# Patient Record
Sex: Male | Born: 1935 | Hispanic: No | Smoking: Former smoker
Health system: Southern US, Community
[De-identification: ages and names within clinical notes are randomized; demographics above are authoritative.]

## PROBLEM LIST (undated history)

## (undated) DIAGNOSIS — J449 Chronic obstructive pulmonary disease, unspecified: Secondary | ICD-10-CM

## (undated) DIAGNOSIS — F419 Anxiety disorder, unspecified: Secondary | ICD-10-CM

## (undated) DIAGNOSIS — E785 Hyperlipidemia, unspecified: Secondary | ICD-10-CM

## (undated) DIAGNOSIS — C61 Malignant neoplasm of prostate: Secondary | ICD-10-CM

## (undated) DIAGNOSIS — I739 Peripheral vascular disease, unspecified: Secondary | ICD-10-CM

## (undated) DIAGNOSIS — I779 Disorder of arteries and arterioles, unspecified: Secondary | ICD-10-CM

## (undated) DIAGNOSIS — I10 Essential (primary) hypertension: Secondary | ICD-10-CM

## (undated) DIAGNOSIS — I714 Abdominal aortic aneurysm, without rupture, unspecified: Secondary | ICD-10-CM

## (undated) HISTORY — PX: OTHER SURGICAL HISTORY: SHX169

## (undated) HISTORY — PX: BACK SURGERY: SHX140

## (undated) HISTORY — PX: CAROTID ENDARTERECTOMY: SUR193

## (undated) HISTORY — PX: ABDOMINAL AORTIC ANEURYSM REPAIR: SUR1152

---

## 1997-12-16 ENCOUNTER — Other Ambulatory Visit: Admission: RE | Admit: 1997-12-16 | Discharge: 1997-12-16 | Payer: Self-pay | Admitting: *Deleted

## 2020-08-29 ENCOUNTER — Ambulatory Visit (HOSPITAL_COMMUNITY): Admit: 2020-08-29 | Payer: Medicare Other | Admitting: Interventional Cardiology

## 2020-08-29 ENCOUNTER — Inpatient Hospital Stay (HOSPITAL_COMMUNITY)
Admission: EM | Disposition: A | Discharge status: Skilled Nursing Facility | Payer: Self-pay | Source: Home / Self Care | Attending: Interventional Cardiology

## 2020-08-29 ENCOUNTER — Other Ambulatory Visit: Payer: Self-pay

## 2020-08-29 ENCOUNTER — Emergency Department (HOSPITAL_COMMUNITY): Payer: Medicare Other

## 2020-08-29 ENCOUNTER — Encounter (HOSPITAL_COMMUNITY)
Admission: EM | Disposition: A | Discharge status: Skilled Nursing Facility | Payer: Self-pay | Source: Home / Self Care | Attending: Interventional Cardiology

## 2020-08-29 ENCOUNTER — Inpatient Hospital Stay (HOSPITAL_COMMUNITY)
Admission: EM | Admit: 2020-08-29 | Discharge: 2020-09-12 | DRG: 246 | Disposition: A | Discharge status: Skilled Nursing Facility | Payer: Medicare Other | Attending: Cardiology | Admitting: Cardiology

## 2020-08-29 ENCOUNTER — Encounter (HOSPITAL_COMMUNITY): Payer: Self-pay | Admitting: Cardiology

## 2020-08-29 DIAGNOSIS — I469 Cardiac arrest, cause unspecified: Secondary | ICD-10-CM | POA: Diagnosis not present

## 2020-08-29 DIAGNOSIS — I959 Hypotension, unspecified: Secondary | ICD-10-CM

## 2020-08-29 DIAGNOSIS — R571 Hypovolemic shock: Secondary | ICD-10-CM | POA: Diagnosis not present

## 2020-08-29 DIAGNOSIS — J189 Pneumonia, unspecified organism: Secondary | ICD-10-CM

## 2020-08-29 DIAGNOSIS — I252 Old myocardial infarction: Secondary | ICD-10-CM | POA: Diagnosis not present

## 2020-08-29 DIAGNOSIS — I462 Cardiac arrest due to underlying cardiac condition: Secondary | ICD-10-CM | POA: Diagnosis present

## 2020-08-29 DIAGNOSIS — R64 Cachexia: Secondary | ICD-10-CM | POA: Diagnosis present

## 2020-08-29 DIAGNOSIS — F419 Anxiety disorder, unspecified: Secondary | ICD-10-CM

## 2020-08-29 DIAGNOSIS — J690 Pneumonitis due to inhalation of food and vomit: Secondary | ICD-10-CM | POA: Diagnosis not present

## 2020-08-29 DIAGNOSIS — Z515 Encounter for palliative care: Secondary | ICD-10-CM

## 2020-08-29 DIAGNOSIS — J9601 Acute respiratory failure with hypoxia: Secondary | ICD-10-CM | POA: Diagnosis not present

## 2020-08-29 DIAGNOSIS — Z8546 Personal history of malignant neoplasm of prostate: Secondary | ICD-10-CM | POA: Diagnosis not present

## 2020-08-29 DIAGNOSIS — R570 Cardiogenic shock: Secondary | ICD-10-CM | POA: Diagnosis not present

## 2020-08-29 DIAGNOSIS — R0602 Shortness of breath: Secondary | ICD-10-CM | POA: Diagnosis not present

## 2020-08-29 DIAGNOSIS — I5041 Acute combined systolic (congestive) and diastolic (congestive) heart failure: Secondary | ICD-10-CM | POA: Diagnosis present

## 2020-08-29 DIAGNOSIS — E43 Unspecified severe protein-calorie malnutrition: Secondary | ICD-10-CM | POA: Diagnosis not present

## 2020-08-29 DIAGNOSIS — I2584 Coronary atherosclerosis due to calcified coronary lesion: Secondary | ICD-10-CM | POA: Diagnosis present

## 2020-08-29 DIAGNOSIS — R627 Adult failure to thrive: Secondary | ICD-10-CM

## 2020-08-29 DIAGNOSIS — J440 Chronic obstructive pulmonary disease with acute lower respiratory infection: Secondary | ICD-10-CM | POA: Diagnosis not present

## 2020-08-29 DIAGNOSIS — Z888 Allergy status to other drugs, medicaments and biological substances status: Secondary | ICD-10-CM | POA: Diagnosis not present

## 2020-08-29 DIAGNOSIS — I714 Abdominal aortic aneurysm, without rupture, unspecified: Secondary | ICD-10-CM

## 2020-08-29 DIAGNOSIS — R1310 Dysphagia, unspecified: Secondary | ICD-10-CM

## 2020-08-29 DIAGNOSIS — J9621 Acute and chronic respiratory failure with hypoxia: Secondary | ICD-10-CM | POA: Diagnosis not present

## 2020-08-29 DIAGNOSIS — I4901 Ventricular fibrillation: Secondary | ICD-10-CM | POA: Diagnosis present

## 2020-08-29 DIAGNOSIS — Z8679 Personal history of other diseases of the circulatory system: Secondary | ICD-10-CM

## 2020-08-29 DIAGNOSIS — Z9189 Other specified personal risk factors, not elsewhere classified: Secondary | ICD-10-CM | POA: Diagnosis not present

## 2020-08-29 DIAGNOSIS — I340 Nonrheumatic mitral (valve) insufficiency: Secondary | ICD-10-CM | POA: Diagnosis not present

## 2020-08-29 DIAGNOSIS — R0902 Hypoxemia: Secondary | ICD-10-CM | POA: Diagnosis not present

## 2020-08-29 DIAGNOSIS — E049 Nontoxic goiter, unspecified: Secondary | ICD-10-CM | POA: Diagnosis present

## 2020-08-29 DIAGNOSIS — I952 Hypotension due to drugs: Secondary | ICD-10-CM | POA: Diagnosis not present

## 2020-08-29 DIAGNOSIS — Z8249 Family history of ischemic heart disease and other diseases of the circulatory system: Secondary | ICD-10-CM

## 2020-08-29 DIAGNOSIS — I2510 Atherosclerotic heart disease of native coronary artery without angina pectoris: Secondary | ICD-10-CM | POA: Diagnosis not present

## 2020-08-29 DIAGNOSIS — Z7189 Other specified counseling: Secondary | ICD-10-CM | POA: Diagnosis not present

## 2020-08-29 DIAGNOSIS — Z681 Body mass index (BMI) 19 or less, adult: Secondary | ICD-10-CM | POA: Diagnosis not present

## 2020-08-29 DIAGNOSIS — I213 ST elevation (STEMI) myocardial infarction of unspecified site: Secondary | ICD-10-CM | POA: Diagnosis present

## 2020-08-29 DIAGNOSIS — I5021 Acute systolic (congestive) heart failure: Secondary | ICD-10-CM | POA: Diagnosis not present

## 2020-08-29 DIAGNOSIS — E785 Hyperlipidemia, unspecified: Secondary | ICD-10-CM

## 2020-08-29 DIAGNOSIS — E872 Acidosis: Secondary | ICD-10-CM | POA: Diagnosis not present

## 2020-08-29 DIAGNOSIS — Z79899 Other long term (current) drug therapy: Secondary | ICD-10-CM

## 2020-08-29 DIAGNOSIS — I25110 Atherosclerotic heart disease of native coronary artery with unstable angina pectoris: Secondary | ICD-10-CM | POA: Diagnosis not present

## 2020-08-29 DIAGNOSIS — D696 Thrombocytopenia, unspecified: Secondary | ICD-10-CM | POA: Diagnosis not present

## 2020-08-29 DIAGNOSIS — Z87891 Personal history of nicotine dependence: Secondary | ICD-10-CM

## 2020-08-29 DIAGNOSIS — I361 Nonrheumatic tricuspid (valve) insufficiency: Secondary | ICD-10-CM

## 2020-08-29 DIAGNOSIS — L89311 Pressure ulcer of right buttock, stage 1: Secondary | ICD-10-CM | POA: Diagnosis not present

## 2020-08-29 DIAGNOSIS — N179 Acute kidney failure, unspecified: Secondary | ICD-10-CM | POA: Diagnosis not present

## 2020-08-29 DIAGNOSIS — Z66 Do not resuscitate: Secondary | ICD-10-CM | POA: Diagnosis not present

## 2020-08-29 DIAGNOSIS — R339 Retention of urine, unspecified: Secondary | ICD-10-CM

## 2020-08-29 DIAGNOSIS — Z9861 Coronary angioplasty status: Secondary | ICD-10-CM

## 2020-08-29 DIAGNOSIS — J441 Chronic obstructive pulmonary disease with (acute) exacerbation: Secondary | ICD-10-CM | POA: Diagnosis present

## 2020-08-29 DIAGNOSIS — Z20822 Contact with and (suspected) exposure to covid-19: Secondary | ICD-10-CM | POA: Diagnosis present

## 2020-08-29 DIAGNOSIS — L8990 Pressure ulcer of unspecified site, unspecified stage: Secondary | ICD-10-CM | POA: Insufficient documentation

## 2020-08-29 DIAGNOSIS — F411 Generalized anxiety disorder: Secondary | ICD-10-CM | POA: Diagnosis not present

## 2020-08-29 DIAGNOSIS — K529 Noninfective gastroenteritis and colitis, unspecified: Secondary | ICD-10-CM | POA: Diagnosis present

## 2020-08-29 DIAGNOSIS — I110 Hypertensive heart disease with heart failure: Secondary | ICD-10-CM | POA: Diagnosis present

## 2020-08-29 DIAGNOSIS — K224 Dyskinesia of esophagus: Secondary | ICD-10-CM | POA: Diagnosis present

## 2020-08-29 DIAGNOSIS — I739 Peripheral vascular disease, unspecified: Secondary | ICD-10-CM | POA: Diagnosis present

## 2020-08-29 DIAGNOSIS — I2109 ST elevation (STEMI) myocardial infarction involving other coronary artery of anterior wall: Secondary | ICD-10-CM

## 2020-08-29 DIAGNOSIS — F410 Panic disorder [episodic paroxysmal anxiety] without agoraphobia: Secondary | ICD-10-CM | POA: Diagnosis present

## 2020-08-29 DIAGNOSIS — I071 Rheumatic tricuspid insufficiency: Secondary | ICD-10-CM | POA: Diagnosis present

## 2020-08-29 DIAGNOSIS — I2102 ST elevation (STEMI) myocardial infarction involving left anterior descending coronary artery: Secondary | ICD-10-CM | POA: Diagnosis not present

## 2020-08-29 DIAGNOSIS — Z955 Presence of coronary angioplasty implant and graft: Secondary | ICD-10-CM

## 2020-08-29 DIAGNOSIS — I255 Ischemic cardiomyopathy: Secondary | ICD-10-CM | POA: Diagnosis present

## 2020-08-29 DIAGNOSIS — D649 Anemia, unspecified: Secondary | ICD-10-CM | POA: Diagnosis present

## 2020-08-29 DIAGNOSIS — I493 Ventricular premature depolarization: Secondary | ICD-10-CM | POA: Diagnosis present

## 2020-08-29 HISTORY — PX: CORONARY STENT INTERVENTION: CATH118234

## 2020-08-29 HISTORY — DX: Hyperlipidemia, unspecified: E78.5

## 2020-08-29 HISTORY — DX: Chronic obstructive pulmonary disease, unspecified: J44.9

## 2020-08-29 HISTORY — DX: Disorder of arteries and arterioles, unspecified: I77.9

## 2020-08-29 HISTORY — DX: Anxiety disorder, unspecified: F41.9

## 2020-08-29 HISTORY — DX: Abdominal aortic aneurysm, without rupture: I71.4

## 2020-08-29 HISTORY — DX: Abdominal aortic aneurysm, without rupture, unspecified: I71.40

## 2020-08-29 HISTORY — PX: CORONARY/GRAFT ACUTE MI REVASCULARIZATION: CATH118305

## 2020-08-29 HISTORY — DX: Essential (primary) hypertension: I10

## 2020-08-29 HISTORY — DX: Malignant neoplasm of prostate: C61

## 2020-08-29 HISTORY — PX: LEFT HEART CATH AND CORONARY ANGIOGRAPHY: CATH118249

## 2020-08-29 HISTORY — DX: Peripheral vascular disease, unspecified: I73.9

## 2020-08-29 LAB — CBC WITH DIFFERENTIAL/PLATELET
Abs Immature Granulocytes: 0.02 10*3/uL (ref 0.00–0.07)
Basophils Absolute: 0 10*3/uL (ref 0.0–0.1)
Basophils Relative: 0 %
Eosinophils Absolute: 0.1 10*3/uL (ref 0.0–0.5)
Eosinophils Relative: 1 %
HCT: 45.3 % (ref 39.0–52.0)
Hemoglobin: 14.1 g/dL (ref 13.0–17.0)
Immature Granulocytes: 0 %
Lymphocytes Relative: 14 %
Lymphs Abs: 1.3 10*3/uL (ref 0.7–4.0)
MCH: 29.8 pg (ref 26.0–34.0)
MCHC: 31.1 g/dL (ref 30.0–36.0)
MCV: 95.8 fL (ref 80.0–100.0)
Monocytes Absolute: 1.1 10*3/uL — ABNORMAL HIGH (ref 0.1–1.0)
Monocytes Relative: 12 %
Neutro Abs: 6.7 10*3/uL (ref 1.7–7.7)
Neutrophils Relative %: 73 %
Platelets: 104 10*3/uL — ABNORMAL LOW (ref 150–400)
RBC: 4.73 MIL/uL (ref 4.22–5.81)
RDW: 13.8 % (ref 11.5–15.5)
WBC: 9.2 10*3/uL (ref 4.0–10.5)
nRBC: 0 % (ref 0.0–0.2)

## 2020-08-29 LAB — COMPREHENSIVE METABOLIC PANEL
ALT: 23 U/L (ref 0–44)
AST: 91 U/L — ABNORMAL HIGH (ref 15–41)
Albumin: 3.3 g/dL — ABNORMAL LOW (ref 3.5–5.0)
Alkaline Phosphatase: 56 U/L (ref 38–126)
Anion gap: 12 (ref 5–15)
BUN: 20 mg/dL (ref 8–23)
CO2: 23 mmol/L (ref 22–32)
Calcium: 8.9 mg/dL (ref 8.9–10.3)
Chloride: 106 mmol/L (ref 98–111)
Creatinine, Ser: 1.1 mg/dL (ref 0.61–1.24)
GFR, Estimated: >60 mL/min (ref >60)
Glucose, Bld: 113 mg/dL — ABNORMAL HIGH (ref 70–99)
Potassium: 3.8 mmol/L (ref 3.5–5.1)
Sodium: 141 mmol/L (ref 135–145)
Total Bilirubin: 0.8 mg/dL (ref 0.3–1.2)
Total Protein: 6.2 g/dL — ABNORMAL LOW (ref 6.5–8.1)

## 2020-08-29 LAB — ECHOCARDIOGRAM COMPLETE
Area-P 1/2: 2.93 cm2
Height: 67 in
S' Lateral: 2.6 cm
Weight: 1872 oz

## 2020-08-29 LAB — POCT I-STAT, CHEM 8
BUN: 22 mg/dL (ref 8–23)
Calcium, Ion: 1.19 mmol/L (ref 1.15–1.40)
Chloride: 109 mmol/L (ref 98–111)
Creatinine, Ser: 0.8 mg/dL (ref 0.61–1.24)
Glucose, Bld: 179 mg/dL — ABNORMAL HIGH (ref 70–99)
HCT: 39 % (ref 39.0–52.0)
Hemoglobin: 13.3 g/dL (ref 13.0–17.0)
Potassium: 3 mmol/L — ABNORMAL LOW (ref 3.5–5.1)
Sodium: 144 mmol/L (ref 135–145)
TCO2: 22 mmol/L (ref 22–32)

## 2020-08-29 LAB — TROPONIN I (HIGH SENSITIVITY)
Troponin I (High Sensitivity): 12408 ng/L (ref <18)
Troponin I (High Sensitivity): >27000 ng/L (ref <18)
Troponin I (High Sensitivity): >27000 ng/L (ref <18)
Troponin I (High Sensitivity): >27000 ng/L (ref <18)

## 2020-08-29 LAB — LIPID PANEL
Cholesterol: 195 mg/dL (ref 0–200)
HDL: 49 mg/dL (ref >40)
LDL Cholesterol: 122 mg/dL — ABNORMAL HIGH (ref 0–99)
Total CHOL/HDL Ratio: 4 RATIO
Triglycerides: 119 mg/dL (ref <150)
VLDL: 24 mg/dL (ref 0–40)

## 2020-08-29 LAB — POCT ACTIVATED CLOTTING TIME
Activated Clotting Time: 374 seconds
Activated Clotting Time: 332 seconds
Activated Clotting Time: 535 seconds

## 2020-08-29 LAB — PROTIME-INR
INR: 1.1 (ref 0.8–1.2)
Prothrombin Time: 14 seconds (ref 11.4–15.2)

## 2020-08-29 LAB — HEMOGLOBIN A1C
Hgb A1c MFr Bld: 5.3 % (ref 4.8–5.6)
Mean Plasma Glucose: 105.41 mg/dL

## 2020-08-29 LAB — APTT: aPTT: 28 seconds (ref 24–36)

## 2020-08-29 LAB — RESP PANEL BY RT-PCR (FLU A&B, COVID) ARPGX2
Influenza A by PCR: NEGATIVE
Influenza B by PCR: NEGATIVE
SARS Coronavirus 2 by RT PCR: NEGATIVE

## 2020-08-29 LAB — GLUCOSE, CAPILLARY
Glucose-Capillary: 167 mg/dL — ABNORMAL HIGH (ref 70–99)
Glucose-Capillary: 185 mg/dL — ABNORMAL HIGH (ref 70–99)
Glucose-Capillary: 194 mg/dL — ABNORMAL HIGH (ref 70–99)

## 2020-08-29 SURGERY — LEFT HEART CATH AND CORONARY ANGIOGRAPHY
Anesthesia: LOCAL

## 2020-08-29 MED ORDER — TICAGRELOR 90 MG PO TABS
ORAL_TABLET | ORAL | Status: AC
  Filled 2020-08-29: qty 2

## 2020-08-29 MED ORDER — ONDANSETRON HCL 4 MG/2ML IJ SOLN: 4.0000 mg | Freq: Four times a day (QID) | INTRAMUSCULAR | Status: DC | PRN

## 2020-08-29 MED ORDER — ROCURONIUM BROMIDE 10 MG/ML (PF) SYRINGE
PREFILLED_SYRINGE | INTRAVENOUS | Status: AC
  Filled 2020-08-29: qty 10

## 2020-08-29 MED ORDER — HEPARIN (PORCINE) IN NACL 1000-0.9 UT/500ML-% IV SOLN
INTRAVENOUS | Status: DC | PRN
  Administered 2020-08-29 (×2): 500 mL

## 2020-08-29 MED ORDER — LISINOPRIL 40 MG PO TABS
40.0000 mg | ORAL_TABLET | Freq: Every day | ORAL | Status: DC
  Administered 2020-08-30: 40 mg via ORAL
  Filled 2020-08-29: qty 2

## 2020-08-29 MED ORDER — AMIODARONE HCL IN DEXTROSE 360-4.14 MG/200ML-% IV SOLN
30.0000 mg/h | INTRAVENOUS | Status: DC
  Administered 2020-08-29 – 2020-08-30 (×2): 30 mg/h via INTRAVENOUS
  Filled 2020-08-29: qty 200

## 2020-08-29 MED ORDER — OXYCODONE HCL 5 MG PO TABS
5.0000 mg | ORAL_TABLET | ORAL | Status: DC | PRN
Start: 2020-08-29 — End: 2020-09-09
  Administered 2020-08-29: 10 mg via ORAL
  Administered 2020-08-29 – 2020-09-09 (×4): 5 mg via ORAL
  Filled 2020-08-29: qty 1
  Filled 2020-08-29: qty 2
  Filled 2020-08-29 (×3): qty 1

## 2020-08-29 MED ORDER — ASPIRIN 81 MG PO CHEW: 81.0000 mg | CHEWABLE_TABLET | Freq: Every day | ORAL | Status: DC

## 2020-08-29 MED ORDER — ASPIRIN 81 MG PO CHEW: 324.0000 mg | CHEWABLE_TABLET | Freq: Once | ORAL | Status: DC

## 2020-08-29 MED ORDER — NITROGLYCERIN IN D5W 200-5 MCG/ML-% IV SOLN
0.0000 ug/min | INTRAVENOUS | Status: DC
  Filled 2020-08-29: qty 250

## 2020-08-29 MED ORDER — HEPARIN (PORCINE) 25000 UT/250ML-% IV SOLN
850.0000 [IU]/h | INTRAVENOUS | Status: DC
  Administered 2020-08-29: 650 [IU]/h via INTRAVENOUS
  Filled 2020-08-29: qty 250

## 2020-08-29 MED ORDER — TICAGRELOR 90 MG PO TABS
ORAL_TABLET | ORAL | Status: DC | PRN
  Administered 2020-08-29: 180 mg via ORAL

## 2020-08-29 MED ORDER — IOHEXOL 350 MG/ML SOLN
INTRAVENOUS | Status: AC
  Filled 2020-08-29: qty 1

## 2020-08-29 MED ORDER — LIDOCAINE HCL (PF) 1 % IJ SOLN
INTRAMUSCULAR | Status: DC | PRN
  Administered 2020-08-29: 2 mL

## 2020-08-29 MED ORDER — SODIUM CHLORIDE 0.9 % IV SOLN: INTRAVENOUS | Status: DC

## 2020-08-29 MED ORDER — SODIUM CHLORIDE 0.9% FLUSH: 3.0000 mL | INTRAVENOUS | Status: DC | PRN

## 2020-08-29 MED ORDER — AMIODARONE HCL IN DEXTROSE 360-4.14 MG/200ML-% IV SOLN
60.0000 mg/h | INTRAVENOUS | Status: DC
  Administered 2020-08-29: 60 mg/h via INTRAVENOUS
  Filled 2020-08-29: qty 200

## 2020-08-29 MED ORDER — SODIUM CHLORIDE 0.9 % IV SOLN
INTRAVENOUS | Status: AC
  Administered 2020-08-29: 100 mL/h via INTRAVENOUS

## 2020-08-29 MED ORDER — HYDRALAZINE HCL 20 MG/ML IJ SOLN
10.0000 mg | INTRAMUSCULAR | Status: AC | PRN
  Filled 2020-08-29: qty 1

## 2020-08-29 MED ORDER — HEPARIN (PORCINE) 25000 UT/250ML-% IV SOLN
650.0000 [IU]/h | INTRAVENOUS | Status: DC
  Filled 2020-08-29: qty 250

## 2020-08-29 MED ORDER — AMIODARONE HCL IN DEXTROSE 360-4.14 MG/200ML-% IV SOLN
INTRAVENOUS | Status: AC
  Administered 2020-08-29: 30 mg/h via INTRAVENOUS
  Filled 2020-08-29: qty 200

## 2020-08-29 MED ORDER — NITROGLYCERIN 0.4 MG SL SUBL
0.4000 mg | SUBLINGUAL_TABLET | Freq: Once | SUBLINGUAL | Status: AC
  Administered 2020-08-29: 0.4 mg via SUBLINGUAL
  Filled 2020-08-29: qty 1

## 2020-08-29 MED ORDER — NITROGLYCERIN 1 MG/10 ML FOR IR/CATH LAB
INTRA_ARTERIAL | Status: AC
  Filled 2020-08-29: qty 10

## 2020-08-29 MED ORDER — ETOMIDATE 2 MG/ML IV SOLN
INTRAVENOUS | Status: AC
  Filled 2020-08-29: qty 20

## 2020-08-29 MED ORDER — SUCCINYLCHOLINE CHLORIDE 200 MG/10ML IV SOSY
PREFILLED_SYRINGE | INTRAVENOUS | Status: AC
  Filled 2020-08-29: qty 10

## 2020-08-29 MED ORDER — ENSURE ENLIVE PO LIQD
237.0000 mL | Freq: Two times a day (BID) | ORAL | Status: DC
  Administered 2020-08-30 – 2020-09-06 (×13): 237 mL via ORAL

## 2020-08-29 MED ORDER — IOHEXOL 350 MG/ML SOLN
INTRAVENOUS | Status: DC | PRN
  Administered 2020-08-29: 220 mL via INTRA_ARTERIAL

## 2020-08-29 MED ORDER — LABETALOL HCL 5 MG/ML IV SOLN
10.0000 mg | INTRAVENOUS | Status: AC | PRN
  Administered 2020-08-29: 10 mg via INTRAVENOUS
  Filled 2020-08-29: qty 4

## 2020-08-29 MED ORDER — SODIUM CHLORIDE 0.9 % IV SOLN
INTRAVENOUS | Status: AC | PRN
  Administered 2020-08-29: 150 mL/h via INTRAVENOUS

## 2020-08-29 MED ORDER — HEPARIN SODIUM (PORCINE) 1000 UNIT/ML IJ SOLN
INTRAMUSCULAR | Status: DC | PRN
  Administered 2020-08-29: 5000 [IU] via INTRAVENOUS
  Administered 2020-08-29: 3000 [IU] via INTRAVENOUS

## 2020-08-29 MED ORDER — LORAZEPAM 0.5 MG PO TABS
0.2500 mg | ORAL_TABLET | Freq: Every evening | ORAL | Status: DC | PRN
  Administered 2020-08-29 – 2020-09-03 (×6): 0.5 mg via ORAL
  Filled 2020-08-29 (×6): qty 1

## 2020-08-29 MED ORDER — HEPARIN SODIUM (PORCINE) 5000 UNIT/ML IJ SOLN
4000.0000 [IU] | Freq: Once | INTRAMUSCULAR | Status: AC
  Administered 2020-08-29: 4000 [IU] via INTRAVENOUS

## 2020-08-29 MED ORDER — EZETIMIBE 10 MG PO TABS
10.0000 mg | ORAL_TABLET | Freq: Every day | ORAL | Status: DC
  Administered 2020-08-29 – 2020-09-12 (×15): 10 mg via ORAL
  Filled 2020-08-29 (×15): qty 1

## 2020-08-29 MED ORDER — HEPARIN (PORCINE) IN NACL 1000-0.9 UT/500ML-% IV SOLN
INTRAVENOUS | Status: AC
  Filled 2020-08-29: qty 1000

## 2020-08-29 MED ORDER — ONDANSETRON HCL 4 MG/2ML IJ SOLN
4.0000 mg | Freq: Four times a day (QID) | INTRAMUSCULAR | Status: DC | PRN
  Administered 2020-09-07: 4 mg via INTRAVENOUS
  Filled 2020-08-29: qty 2

## 2020-08-29 MED ORDER — TICAGRELOR 90 MG PO TABS
90.0000 mg | ORAL_TABLET | Freq: Two times a day (BID) | ORAL | Status: DC
  Administered 2020-08-29 – 2020-09-12 (×29): 90 mg via ORAL
  Filled 2020-08-29 (×29): qty 1

## 2020-08-29 MED ORDER — NITROGLYCERIN 0.4 MG SL SUBL: 0.4000 mg | SUBLINGUAL_TABLET | SUBLINGUAL | Status: DC | PRN

## 2020-08-29 MED ORDER — TAMSULOSIN HCL 0.4 MG PO CAPS
0.4000 mg | ORAL_CAPSULE | Freq: Every day | ORAL | Status: DC
  Administered 2020-08-30 – 2020-09-04 (×6): 0.4 mg via ORAL
  Filled 2020-08-29 (×6): qty 1

## 2020-08-29 MED ORDER — AMIODARONE LOAD VIA INFUSION
150.0000 mg | Freq: Once | INTRAVENOUS | Status: DC
  Filled 2020-08-29: qty 83.34

## 2020-08-29 MED ORDER — METOPROLOL TARTRATE 50 MG PO TABS
50.0000 mg | ORAL_TABLET | Freq: Two times a day (BID) | ORAL | Status: DC
  Administered 2020-08-29: 50 mg via ORAL
  Filled 2020-08-29: qty 1

## 2020-08-29 MED ORDER — VERAPAMIL HCL 2.5 MG/ML IV SOLN
INTRAVENOUS | Status: DC | PRN
  Administered 2020-08-29: 10 mL via INTRA_ARTERIAL

## 2020-08-29 MED ORDER — ASPIRIN 300 MG RE SUPP: 300.0000 mg | RECTAL | Status: DC

## 2020-08-29 MED ORDER — BUSPIRONE HCL 15 MG PO TABS
15.0000 mg | ORAL_TABLET | Freq: Every day | ORAL | Status: DC
  Administered 2020-08-30 – 2020-09-12 (×14): 15 mg via ORAL
  Filled 2020-08-29: qty 2
  Filled 2020-08-29: qty 1
  Filled 2020-08-29 (×2): qty 3
  Filled 2020-08-29: qty 1
  Filled 2020-08-29: qty 3
  Filled 2020-08-29: qty 1
  Filled 2020-08-29: qty 3
  Filled 2020-08-29 (×3): qty 1
  Filled 2020-08-29: qty 3
  Filled 2020-08-29: qty 1
  Filled 2020-08-29: qty 2
  Filled 2020-08-29: qty 3

## 2020-08-29 MED ORDER — SODIUM CHLORIDE 0.9% FLUSH
3.0000 mL | Freq: Two times a day (BID) | INTRAVENOUS | Status: DC
  Administered 2020-08-29 – 2020-09-12 (×20): 3 mL via INTRAVENOUS

## 2020-08-29 MED ORDER — VERAPAMIL HCL 2.5 MG/ML IV SOLN
INTRAVENOUS | Status: AC
  Filled 2020-08-29: qty 2

## 2020-08-29 MED ORDER — ASPIRIN EC 81 MG PO TBEC
81.0000 mg | DELAYED_RELEASE_TABLET | Freq: Every day | ORAL | Status: DC
  Administered 2020-08-30 – 2020-09-12 (×14): 81 mg via ORAL
  Filled 2020-08-29 (×14): qty 1

## 2020-08-29 MED ORDER — LIDOCAINE HCL (PF) 1 % IJ SOLN
INTRAMUSCULAR | Status: AC
  Filled 2020-08-29: qty 30

## 2020-08-29 MED ORDER — ACETAMINOPHEN 325 MG PO TABS
650.0000 mg | ORAL_TABLET | ORAL | Status: DC | PRN
  Administered 2020-09-03 – 2020-09-12 (×8): 650 mg via ORAL
  Filled 2020-08-29 (×8): qty 2

## 2020-08-29 MED ORDER — ACETAMINOPHEN 325 MG PO TABS: 650.0000 mg | ORAL_TABLET | ORAL | Status: DC | PRN

## 2020-08-29 MED ORDER — ASPIRIN 81 MG PO CHEW: 324.0000 mg | CHEWABLE_TABLET | ORAL | Status: DC

## 2020-08-29 MED ORDER — SODIUM CHLORIDE 0.9 % IV SOLN: 250.0000 mL | INTRAVENOUS | Status: DC | PRN

## 2020-08-29 MED ORDER — HEPARIN SODIUM (PORCINE) 1000 UNIT/ML IJ SOLN
INTRAMUSCULAR | Status: AC
  Filled 2020-08-29: qty 1

## 2020-08-29 SURGICAL SUPPLY — 24 items
BALLN SAPPHIRE 2.5X15 (BALLOONS) ×2
BALLN SPRINTER MX OTW 2.0X12 (BALLOONS) ×2
BALLOON SAPPHIRE 2.5X15 (BALLOONS) IMPLANT
BALLOON SPRINTER MX OTW 2.0X12 (BALLOONS) IMPLANT
CATH 5FR JL3.5 JR4 ANG PIG MP (CATHETERS) ×1 IMPLANT
CATH LAUNCHER 6FR EBU3.5 (CATHETERS) ×1 IMPLANT
CATH OPTICROSS HD (CATHETERS) ×1 IMPLANT
CATH TELESCOPE 6F GEC (CATHETERS) ×1 IMPLANT
DEVICE RAD COMP TR BAND LRG (VASCULAR PRODUCTS) ×1 IMPLANT
GLIDESHEATH SLEND A-KIT 6F 22G (SHEATH) ×1 IMPLANT
GUIDEWIRE ANGLED .035X150CM (WIRE) ×1 IMPLANT
GUIDEWIRE INQWIRE 1.5J.035X260 (WIRE) IMPLANT
INQWIRE 1.5J .035X260CM (WIRE) ×2
KIT ENCORE 26 ADVANTAGE (KITS) ×1 IMPLANT
KIT HEART LEFT (KITS) ×2 IMPLANT
PACK CARDIAC CATHETERIZATION (CUSTOM PROCEDURE TRAY) ×2 IMPLANT
SHEATH PROBE COVER 6X72 (BAG) ×1 IMPLANT
SLED PULL BACK IVUS (MISCELLANEOUS) ×1 IMPLANT
STENT RESOLUTE ONYX 2.75X22 (Permanent Stent) ×1 IMPLANT
TRANSDUCER W/STOPCOCK (MISCELLANEOUS) ×2 IMPLANT
TUBING CIL FLEX 10 FLL-RA (TUBING) ×2 IMPLANT
WIRE ASAHI PROWATER 180CM (WIRE) ×1 IMPLANT
WIRE ASAHI PROWATER 300CM (WIRE) ×1 IMPLANT
WIRE HI TORQ VERSACORE-J 145CM (WIRE) ×1 IMPLANT

## 2020-08-29 NOTE — Progress Notes (Signed)
Patient coded as I was putting on 95 and outside of room. Will do echo after cath lab. Confirmed [place].

## 2020-08-29 NOTE — Progress Notes (Signed)
ANTICOAGULATION CONSULT NOTE  Pharmacy Consult for heparin Indication: chest pain/ACS  Heparin Dosing Weight: 53.1 kg  Labs: No results for input(s): HGB, HCT, PLT, APTT, LABPROT, INR, HEPARINUNFRC, HEPRLOWMOCWT, CREATININE, CKTOTAL, CKMB, TROPONINIHS in the last 72 hours.  CrCl cannot be calculated (No successful lab value found.).  Assessment: 72 yom presenting with CP. Pharmacy consulted to dose heparin. Patient is not on anticoagulation PTA. CBC still pending. No active bleed issues documented.  Patient already received heparin 4000 bolus x 1 in the ER. Cardiology may take patient to cath later today or tomorrow but not urgent per Dr. Katrinka Blazing. Per patient, he is 5'7" and weighs 117 lbs.  Goal of Therapy:  Heparin level 0.3-0.7 units/ml Monitor platelets by anticoagulation protocol: Yes   Plan:  No further heparin bolus Start heparin at 650 units/hr 6hr heparin level Monitor daily heparin level and CBC, s/sx bleeding F/u Cardiology plans for possible cath   Leia Alf, PharmD, BCPS Please check AMION for all [facility] contact numbers Clinical Pharmacist 08/29/2020 10:55 AM

## 2020-08-29 NOTE — Plan of Care (Signed)
  Problem: Cardiovascular: Goal: Ability to achieve and maintain adequate cardiovascular perfusion will improve Outcome: Progressing   Problem: Cardiovascular: Goal: Vascular access site(s) Level 0-1 will be maintained Outcome: Progressing   

## 2020-08-29 NOTE — ED Triage Notes (Signed)
Reported dull chest pain since Sunday, and was at urgent care today, diaphoretic, pale. ASA 324 mg and NTG 0.4 Sl given en route.

## 2020-08-29 NOTE — Progress Notes (Signed)
NAME:  John Fields, MRN:  [id number], DOB:  [birth year], LOS: 0 ADMISSION DATE:  08/29/2020, CONSULTATION DATE:  08/29/2020 REFERRING MD:  Mendel Ryder - [facility], CHIEF COMPLAINT:  Hypoxia following cardiac arrest.    HPI/course in hospital  86 year old vasculopath presented with new onset RSCP without radiation or dyspnea, intermittently since 12/26 without clear relation to exertion.  Persistent chest pain since 0100. In ED, EKG showed anterior STEMI ( TIMI score 6). Suffered brief arrest in ED per cardiology (no documentation). Brought to cath lab for emergent revascularization.  Review of recent medical history: AAA repaired EVAR 2010, chronic diarrhea, possibly statin related. PVD. COPD by history - not on home O2, no pulmonary follow-up, no recent PFTs.  Quit smoking several years ago.   Past Medical History   Past Medical History:  Diagnosis Date  . Abdominal aortic aneurysm (AAA) (HCC)   . Anxiety   . Carotid artery disease (HCC)   . COPD (chronic obstructive pulmonary disease) (HCC)   . Hyperlipidemia   . Hypertension   . Peripheral vascular disease (HCC)   . Prostate cancer [facility])      Past Surgical History:  Procedure Laterality Date  . ABDOMINAL AORTIC ANEURYSM REPAIR    . BACK SURGERY    . CAROTID ENDARTERECTOMY    . Cataract surgery       Review of Systems:   Review of Systems  Unable to perform ROS: Critical illness    Social History   reports that he has quit smoking. He has never used smokeless tobacco. He reports previous alcohol use.   Family History   His family history includes Heart disease in his brother.   Allergies Allergies  Allergen Reactions  . Requip [Ropinirole] Other (See Comments)    insomnia  . Statins Diarrhea     Home Medications  Prior to Admission medications   Medication Sig Start Date End Date Taking? Authorizing Provider  busPIRone (BUSPAR) 15 MG tablet Take 15 mg by mouth daily. 07/09/20   [provider]   lisinopril (ZESTRIL) 40 MG tablet Take 40 mg by mouth daily. 07/09/20   [provider]  LORazepam (ATIVAN) 0.5 MG tablet Take 0.25-0.5 mg by mouth at bedtime as needed for sleep. 05/14/20   [provider]  metoprolol tartrate (LOPRESSOR) 100 MG tablet Take 50 mg by mouth 2 (two) times daily. 07/09/20   [provider]  tamsulosin (FLOMAX) 0.4 MG CAPS capsule Take 0.4 mg by mouth daily. 07/27/20   [provider]     Interim history/subjective:  Uneventful PCI to proximal LAD lesion.  LVEDP 9.  Patient complains of mild residual chest pain but no shortness of breath.  Objective   Blood pressure (!) 142/83, pulse 100, temperature 97.8 F (36.6 C), temperature source Oral, resp. rate 15, height 5\' 7"  (1.702 m), weight 53.1 kg, SpO2 100 %.       No intake or output data in the 24 hours ending 08/29/20 1156 Filed Weights   08/29/20 1045  Weight: 53.1 kg    Examination: Physical Exam Constitutional:      Appearance: He is underweight.  HENT:     Head: Normocephalic.  Eyes:     Extraocular Movements: Extraocular movements intact.     Pupils: Pupils are equal, round, and reactive to light.  Cardiovascular:     Rate and Rhythm: Normal rate and regular rhythm.     Chest Wall: PMI is not displaced.     Heart  sounds: Normal heart sounds. No S3 or S4 sounds.   Pulmonary:     Effort: Pulmonary effort is normal. No tachypnea.     Breath sounds: Normal breath sounds.  Abdominal:     Palpations: Abdomen is soft.  Skin:    General: Skin is warm and dry.     Capillary Refill: Capillary refill takes 2 to 3 seconds.  Neurological:     General: No focal deficit present.     Mental Status: He is alert.      Ancillary tests (personally reviewed)  CBC: Recent Labs  Lab 08/29/20 1043  WBC 9.2  NEUTROABS 6.7  HGB 14.1  HCT 45.3  MCV 95.8  PLT 104*    Basic Metabolic Panel: No results for input(s): NA, K, CL, CO2, GLUCOSE, BUN, CREATININE,  CALCIUM, MG, PHOS in the last 168 hours. GFR: CrCl cannot be calculated (No successful lab value found.). Recent Labs  Lab 08/29/20 1043  WBC 9.2    Liver Function Tests: No results for input(s): AST, ALT, ALKPHOS, BILITOT, PROT, ALBUMIN in the last 168 hours. No results for input(s): LIPASE, AMYLASE in the last 168 hours. No results for input(s): AMMONIA in the last 168 hours.  ABG No results found for: PHART, PCO2ART, PO2ART, HCO3, TCO2, ACIDBASEDEF, O2SAT   Coagulation Profile: Recent Labs  Lab 08/29/20 1043  INR 1.1    Cardiac Enzymes: No results for input(s): CKTOTAL, CKMB, CKMBINDEX, TROPONINI in the last 168 hours.  HbA1C: Hgb A1c MFr Bld  Date/Time Value Ref Range Status  08/29/2020 10:43 AM 5.3 4.8 - 5.6 % Final    Comment:    (NOTE) Pre diabetes:          5.7%-6.4%  Diabetes:              >6.4%  Glycemic control for   <7.0% adults with diabetes     CBG: No results for input(s): GLUCAP in the last 168 hours.  Chest x-ray 12/28 (personally reviewed): Hyperinflated lung fields bilaterally.  No evidence of pulmonary edema.  Assessment & Plan:   Critically ill due to acute ST elevation anterior wall myocardial infarction Killip class I Status post VF cardiac arrest likely ischemic in origin. Calcified coronary artery disease Peripheral vascular disease Status post EVAR repair of AAA COPD by history Possible microscopic colitis.  Plan: Admit to ICU Continue amiodarone for further 24 hours then transition to home beta-blocker dose Gentle post-cath hydration.  No diuresis Continue IV heparin as at high risk for LV thrombus given delayed presentation and evidence of anterior wall LV dysfunction on LV gram. Echocardiogram pending Dual antiplatelet therapy per cardiology Restart home ACE inhibitor tomorrow, follow renal function post cath. Dyslipidemia with statin intolerance.  We will start Zetia as initial therapy and add PCSK9 inibitor   Daily  Goals Checklist  Pain/Anxiety/Delirium protocol (if indicated): Tylenol PM VAP protocol (if indicated): Not intubated Respiratory support goals: Wean oxygen as tolerated.  Incentive spirometry. Blood pressure target: Systolic blood pressure less than 140 DVT prophylaxis: On systemic heparin  Nutritional status and feeding goals: Progressive cardiac diet GI prophylaxis: Not indicated Fluid status goals: IV hydration 6 hours post Urinary catheter: Not required Central lines: PIV's only Glucose control: No history of diabetes, monitor only. Mobility/therapy needs: Phase 1 cardiac Antibiotic de-escalation: No antibiotic Home medication reconciliation: Home medications reconciled Daily labs: BMP daily Code Status: Full code Family Communication: Cardiology to update family Disposition: To ICU  CRITICAL CARE Performed by: Kipp Brood   Total  critical care time: 35 minutes  Critical care time was exclusive of separately billable procedures and treating other patients.  Critical care was necessary to treat or prevent imminent or life-threatening deterioration.  Critical care was time spent personally by me on the following activities: development of treatment plan with patient and/or surrogate as well as nursing, discussions with consultants, evaluation of patient's response to treatment, examination of patient, obtaining history from patient or surrogate, ordering and performing treatments and interventions, ordering and review of laboratory studies, ordering and review of radiographic studies, pulse oximetry, re-evaluation of patient's condition and participation in multidisciplinary rounds.  Kipp Brood, MD [facility] ICU Physician [place]  Pager: [phone] Mobile: [phone] After hours: [phone].  08/29/2020, 11:56 AM

## 2020-08-29 NOTE — ED Provider Notes (Signed)
[facility] EMERGENCY DEPARTMENT Provider Note   CSN: [id number] Arrival date & time: 08/29/20  1029     History Chief Complaint  Patient presents with  . Chest Pain  . Code STEMI    John Fields is a 86 y.o. male.  HPI     This is an 86 year old male with a history of hypertension who presents by EMS with concerns for a STEMI.  Per EMS, he was at urgent care and had some lateral ST changes that were highly concerning.  Patient reports he had onset of pressure-like chest discomfort on Sunday evening.  Has not necessarily been exertional.  It has been constant with waxing and waning intensity.  He has not had any shortness of breath or diaphoresis.  No known history of heart disease.  He is a former smoker but has not smoked in 11 years.  Has not noted any lower extremity swelling and no history of blood clots.  Unfortunately in route, EMS EKG was highly artifactual.  Per cath lab team, no activation based on that EKG.  However, they will evaluate patient upon arrival.  Upon arrival, urgent care EKG reviewed by myself.  It showed some biphasic T and ST elevation in V3 through V5.  No reciprocal changes.  Patient is having some ongoing pain which he rates at 5 out of 10.  He was given aspirin and nitroglycerin by EMS with some improvement.  Denies any recent fevers or cough.  No infectious symptoms.  Level 5 caveat  Past Medical History:  Diagnosis Date  . Abdominal aortic aneurysm (AAA) ([place])   . Anxiety   . Carotid artery disease ([place])   . COPD (chronic obstructive pulmonary disease) ([place])   . Hyperlipidemia   . Hypertension   . Peripheral vascular disease ([place])   . Prostate cancer ([place])     There are no problems to display for this patient.   Past Surgical History:  Procedure Laterality Date  . ABDOMINAL AORTIC ANEURYSM REPAIR    . BACK SURGERY    . CAROTID ENDARTERECTOMY    . Cataract surgery         Family History  Problem Relation Age of  Onset  . Heart disease Brother        Valve replacement    Social History   Tobacco Use  . Smoking status: Former Research scientist (life sciences)  . Smokeless tobacco: Never Used  Substance Use Topics  . Alcohol use: Not Currently    Home Medications Prior to Admission medications   Medication Sig Start Date End Date Taking? Authorizing Provider  busPIRone (BUSPAR) 15 MG tablet Take 15 mg by mouth daily. 07/09/20   [provider]  lisinopril (ZESTRIL) 40 MG tablet Take 40 mg by mouth daily. 07/09/20   [provider]  LORazepam (ATIVAN) 0.5 MG tablet Take 0.25-0.5 mg by mouth at bedtime as needed for sleep. 05/14/20   [provider]  metoprolol tartrate (LOPRESSOR) 100 MG tablet Take 100 mg by mouth daily. 07/09/20   [provider]  tamsulosin (FLOMAX) 0.4 MG CAPS capsule Take 0.4 mg by mouth daily. 07/27/20   [provider]    Allergies    Requip [ropinirole] and Statins  Review of Systems   Review of Systems  Constitutional: Negative for fever.  Respiratory: Negative for shortness of breath.   Cardiovascular: Positive for chest pain. Negative for leg swelling.  Gastrointestinal: Negative for abdominal pain, nausea and vomiting.  Genitourinary: Negative for dysuria.  All  other systems reviewed and are negative.   Physical Exam Updated Vital Signs BP (!) 142/83   Pulse 100   Temp 97.8 F (36.6 C) (Oral)   Resp 15   Ht 1.702 m (5\' 7" )   Wt 53.1 kg   SpO2 100%   BMI 18.32 kg/m   Physical Exam Vitals and nursing note reviewed.  Constitutional:      Appearance: He is well-developed and well-nourished.     Comments: Elderly, non-ill-appearing  HENT:     Head: Normocephalic and atraumatic.  Eyes:     Pupils: Pupils are equal, round, and reactive to light.  Cardiovascular:     Rate and Rhythm: Normal rate and regular rhythm.     Heart sounds: Normal heart sounds. No murmur heard.   Pulmonary:     Effort: Pulmonary effort is normal. No  respiratory distress.     Breath sounds: Normal breath sounds. No wheezing.  Abdominal:     General: Bowel sounds are normal.     Palpations: Abdomen is soft.     Tenderness: There is no abdominal tenderness. There is no rebound.  Musculoskeletal:        General: No edema.     Cervical back: Neck supple.     Right lower leg: No tenderness. No edema.     Left lower leg: No tenderness. No edema.  Lymphadenopathy:     Cervical: No cervical adenopathy.  Skin:    General: Skin is warm and dry.  Neurological:     Mental Status: He is alert and oriented to person, place, and time.  Psychiatric:        Mood and Affect: Mood and affect and mood normal.     ED Results / Procedures / Treatments   Labs (all labs ordered are listed, but only abnormal results are displayed) Labs Reviewed  CBC WITH DIFFERENTIAL/PLATELET - Abnormal; Notable for the following components:      Result Value   Platelets 104 (*)    Monocytes Absolute 1.1 (*)    All other components within normal limits  RESP PANEL BY RT-PCR (FLU A&B, COVID) [id number]  PROTIME-INR  APTT  HEMOGLOBIN A1C  COMPREHENSIVE METABOLIC PANEL  LIPID PANEL  HEPARIN LEVEL (UNFRACTIONATED)  TROPONIN I (HIGH SENSITIVITY)    EKG EKG Interpretation  Date/Time:  Tuesday August 29 2020 10:34:44 EST Ventricular Rate:  79 PR Interval:    QRS Duration: 89 QT Interval:  423 QTC Calculation: 485 R Axis:   64 Text Interpretation: Sinus rhythm Ventricular premature complex Probable anterolateral infarct, acute Abnormal T, probable ischemia, lateral leads Baseline wander in lead(s) V2 >>> Acute MI <<< Biphasic T with ST elevation V3-V5, concerning Confirmed by Thayer Jew [phone]) on 08/29/2020 10:48:07 AM  Post arrest EKG  EKG Interpretation  Date/Time:  Tuesday August 29 2020 11:30:52 EST Ventricular Rate:  96 PR Interval:    QRS Duration: 73 QT Interval:  330 QTC Calculation: 417 R Axis:   -7 Text Interpretation: Sinus  tachycardia Multiform ventricular premature complexes Probable left atrial enlargement Anterior infarct, possibly acute Lateral leads are also involved >>> Acute MI <<< Confirmed by Thayer Jew [id number]) on 08/29/2020 11:42:47 AM        Radiology DG Chest Port 1 View  Result Date: 08/29/2020 CLINICAL DATA:  Chest pain. EXAM: PORTABLE CHEST 1 VIEW COMPARISON:  03/17/2019. 09/10/2017. Thyroid ultrasound report 07/03/2016. CT chest 06/26/2016. FINDINGS: Large left paratracheal soft tissue density consistent with known thyroid mass. Stable cardiomegaly. No pulmonary venous  congestion. Stable mild bilateral interstitial prominence consistent chronic interstitial lung disease. Prominent nipple shadow again noted on the right. No focal infiltrate. Degenerative change thoracic spine. IMPRESSION: 1. Large left paratracheal soft tissue density consistent with known thyroid mass. 2. Stable cardiomegaly. No pulmonary venous congestion. 3. Stable mild bilateral interstitial prominence consistent with chronic interstitial lung disease. No acute infiltrate. Electronically Signed   By: Marcello Moores  Register   On: 08/29/2020 11:15    Procedures .Critical Care Performed by: Merryl Hacker, MD Authorized by: Merryl Hacker, MD   Critical care provider statement:    Critical care time (minutes):  60   Critical care time was exclusive of:  Separately billable procedures and treating other patients   Critical care was necessary to treat or prevent imminent or life-threatening deterioration of the following conditions:  Cardiac failure   Critical care was time spent personally by me on the following activities:  Ordering and performing treatments and interventions, ordering and review of radiographic studies, pulse oximetry, re-evaluation of patient's condition, evaluation of patient's response to treatment, discussions with consultants and development of treatment plan with patient or  surrogate CPR  Date/Time: 08/29/2020 11:40 AM Performed by: Merryl Hacker, MD Authorized by: Merryl Hacker, MD  CPR Procedure Details:      Amount of time prior to administration of ACLS/BLS (minutes):  5   ACLS/BLS initiated by EMS: No     CPR/ACLS performed in the ED: Yes     Duration of CPR (minutes):  3   Outcome: ROSC obtained    CPR performed via ACLS guidelines under my direct supervision.  See RN documentation for details including defibrillator use, medications, doses and timing.   (including critical care time)  Medications Ordered in ED Medications  0.9 %  sodium chloride infusion (has no administration in time range)  aspirin chewable tablet 324 mg (324 mg Oral Not Given 08/29/20 1043)  nitroGLYCERIN 50 mg in dextrose 5 % 250 mL (0.2 mg/mL) infusion (has no administration in time range)  heparin ADULT infusion 100 units/mL (25000 units/267mL) (has no administration in time range)  rocuronium bromide 100 MG/10ML SOSY (has no administration in time range)  etomidate (AMIDATE) 2 MG/ML injection (has no administration in time range)  succinylcholine (ANECTINE) 200 MG/10ML syringe (has no administration in time range)  amiodarone (NEXTERONE) 1.8 mg/mL load via infusion 150 mg (has no administration in time range)    Followed by  amiodarone (NEXTERONE PREMIX) 360-4.14 MG/200ML-% (1.8 mg/mL) IV infusion (has no administration in time range)    Followed by  amiodarone (NEXTERONE PREMIX) 360-4.14 MG/200ML-% (1.8 mg/mL) IV infusion (30 mg/hr Intravenous New Bag/Given 08/29/20 1128)  heparin injection 4,000 Units (4,000 Units Intravenous Given 08/29/20 1044)  nitroGLYCERIN (NITROSTAT) SL tablet 0.4 mg (0.4 mg Sublingual Given 08/29/20 1046)    ED Course  I have reviewed the triage vital signs and the nursing notes.  Pertinent labs & imaging results that were available during my care of the patient were reviewed by me and considered in my medical decision making (see  chart for details).  Clinical Course as of 08/29/20 1140  Tue Aug 29, 2020  1040 Requested STEMI activation given bedside EKG.  Per Air cabin crew, Cath Lab advised.  Given prehospital activation, no reactivation. [CH]  1052 Cardiology, Dr. Tamala Julian and Dr. Stanford Breed at the bedside.  Feel this is likely a subacute event.  Will await testing. [id number] Called to patient's room as he was arresting.  Noted to be in a V. fib arrest by nursing.  He was quickly shocked and given 1 mg of epinephrine.  My colleague is at the bedside.  Upon my arrival, patient noted to have good femoral pulse after 1 minute of CPR.  Initially groggy but mental status improved and was able to answer questions.  He was given an amiodarone bolus and started on amiodarone drip.  Cardiology emergently consulted again.  Repeat EKG shows ST elevation more prominently in V3.  Dr. Jens Som is at the bedside.  They will take him emergently to the Cath Lab postarrest.  Amiodarone is infusing.  He has received his heparin bolus and a full dose aspirin.  Given that his mental status has significantly improved, no indication for intubation at this time and Dr. Jens Som states he feels comfortable taking him to the Cath Lab.   [CH]    Clinical Course User Index [CH] Baneen Wieseler, Mayer Masker, MD   MDM Rules/Calculators/A&P                          Patient presents by EMS with concerns for STEMI.  Unfortunately, information provided prehospital was not definitive.  On my evaluation in urgent care EKG, I do find it highly concerning and repeat EKG here shows V3 through V5 changes.  Cardiology is at the bedside emergently.  Decision made that this is likely a subacute event and would hold off for additional information.  He did receive a heparin bolus, aspirin.  See clinical course above.  Patient had a brief V. fib arrest necessitating CPR and shock.  Cardiology to take patient emergently to the Cath Lab.  Postarrest EKG with more definitive  ST elevations in V3 specifically.  He is not having any active chest pain at this time.   Final Clinical Impression(s) / ED Diagnoses Final diagnoses:  ST elevation myocardial infarction (STEMI), unspecified artery [facility])    Rx / DC Orders ED Discharge Orders    None       Shon Baton, MD 08/29/20 1143

## 2020-08-29 NOTE — Progress Notes (Signed)
*  PRELIMINARY RESULTS* Echocardiogram 2D Echocardiogram has been performed.  [name] 08/29/2020, 3:36 PM

## 2020-08-29 NOTE — Progress Notes (Signed)
ANTICOAGULATION CONSULT NOTE  Pharmacy Consult for heparin Indication: chest pain/ACS  Heparin Dosing Weight: 53.1 kg  Labs: Recent Labs    08/29/20 1043  HGB 14.1  HCT 45.3  PLT 104*  APTT 28  LABPROT 14.0  INR 1.1  CREATININE 1.10  TROPONINIHS 12,408*    Estimated Creatinine Clearance: 34.9 mL/min (by C-G formula based on SCr of 1.1 mg/dL).  Assessment: 19 yom presenting with CP. Pharmacy consulted to dose heparin. Patient is not on anticoagulation PTA. Pt s/p LHC with PCI completed. Pharmacy to resume IV heparin 8h after sheath removal with late presenting MI and risk for apical thrombus.  Goal of Therapy:  Heparin level 0.3-0.7 units/ml Monitor platelets by anticoagulation protocol: Yes   Plan:  Heparin 650 units/h no bolus at 2130 Check 8h heparin level   Fredonia Highland, PharmD, BCPS, [facility] Clinical Pharmacist [phone] Please check AMION for all [facility] Pharmacy numbers 08/29/2020

## 2020-08-29 NOTE — H&P (Addendum)
Cardiology Admission History and Physical:   Patient ID: John Fields MRN: [id number]; DOB: 1930-05-20   Admission date: 08/29/2020  Primary Care Provider: No primary care provider on file. [facility] Cardiologist: New   Chief Complaint:  Acute MI  Patient Profile:   John Fields is a 86 y.o. male with past medical history of hypertension, hyperlipidemia, carotid artery disease, peripheral vascular disease, previous abdominal aortic aneurysm repair followed at [facility], prostate cancer, COPD with acute anterior myocardial infarction.  History of Present Illness:    Patient awoke at 1 AM on December 27 with chest pressure and shortness of breath.  The pain was not pleuritic nor did it radiate.  No associated nausea or diaphoresis.  The pain has been persistent since that time.  He therefore presented for further evaluation and electrocardiogram consistent with acute anterior myocardial infarction.  His pain is persistent but less severe.  Past Medical History:  Diagnosis Date  . Abdominal aortic aneurysm (AAA) ([place])   . Anxiety   . Carotid artery disease ([place])   . COPD (chronic obstructive pulmonary disease) ([place])   . Hyperlipidemia   . Hypertension   . Peripheral vascular disease ([place])   . Prostate cancer [facility])     Past Surgical History:  Procedure Laterality Date  . ABDOMINAL AORTIC ANEURYSM REPAIR    . BACK SURGERY    . CAROTID ENDARTERECTOMY    . Cataract surgery       Medications Prior to Admission: Prior to Admission medications   Not on File     Allergies:    Allergies  Allergen Reactions  . Requip [Ropinirole] Other (See Comments)    insomnia  . Statins Diarrhea    Social History:   Social History   Socioeconomic History  . Marital status: Widowed    Spouse name: Not on file  . Number of children: 1  . Years of education: Not on file  . Highest education level: Not on file  Occupational History  . Not on file   Tobacco Use  . Smoking status: Former Research scientist (life sciences)  . Smokeless tobacco: Never Used  Substance and Sexual Activity  . Alcohol use: Not Currently  . Drug use: Not on file  . Sexual activity: Not on file  Other Topics Concern  . Not on file  Social History Narrative  . Not on file   Social Determinants of Health   Financial Resource Strain: Not on file  Food Insecurity: Not on file  Transportation Needs: Not on file  Physical Activity: Not on file  Stress: Not on file  Social Connections: Not on file  Intimate Partner Violence: Not on file    Family History:   The patient's family history includes Heart disease in his brother.    ROS:  Please see the history of present illness.  No fevers, chills, productive cough, hemoptysis or gross hematuria.  Some weakness.  All other ROS reviewed and negative.     Physical Exam/Data:   Vitals:   08/29/20 1033 08/29/20 1045  BP: (!) 183/94 (!) 166/88  Pulse: 77 79  Resp: (!) 28 (!) 24  Temp: 97.8 F (36.6 C)   TempSrc: Oral   SpO2: 98% 98%  Weight:  53.1 kg  Height:  5\' 7"  (1.702 m)   No intake or output data in the 24 hours ending 08/29/20 1122 Last 3 Weights 08/29/2020  Weight (lbs) 117 lb  Weight (kg) 53.071 kg     Body mass index  is 18.32 kg/m.  General:  Well nourished, well developed, in no acute distress HEENT: normal Lymph: no adenopathy Neck: no JVD, status post right carotid endarterectomy, bilateral carotid bruits. Endocrine:  No thryomegaly Vascular: FA pulses 2+ bilaterally  Cardiac:  normal S1, S2; RRR; no murmur  Lungs: Diminished breath sounds throughout Abd: soft, nontender, no hepatomegaly  Ext: no edema; diminished DP pulses bilaterally. Musculoskeletal:  No deformities, BUE and BLE strength normal and equal Skin: warm and dry  Neuro:  CNs 2-12 intact, no focal abnormalities noted Psych:  Normal affect    EKG:  The ECG that was done was personally reviewed and demonstrates normal sinus rhythm, PVC,  anterior infarct with ST elevation in V3 through V6 and Q waves V1 through V4.   Laboratory Data: Pending  Radiology/Studies:  Pending  Assessment and Plan:   1. Acute anterior ST elevation myocardial infarction-patient's electrocardiogram consistent with acute MI. However his symptoms began approximately 30 hours prior to presentation. He has mild residual pain. We will treat with aspirin, heparin and continue metoprolol 50 mg twice daily. He is intolerant to statins. We will arrange an echocardiogram to assess LV function. We will await laboratories and if renal function acceptable we will proceed with cardiac catheterization. I have reviewed with Dr. Katrinka Blazing and we do not feel urgent catheterization is indicated given late presentation. 2. Hypertension-patient is on lisinopril 40 mg daily and Lopressor 50 mg twice daily at home. Will continue at present dose (hold lisinopril until renal function known). 3. Hyperlipidemia-patient is intolerant to statins. Check lipids. If elevated will consider Zetia or Repatha. 4. Peripheral vascular disease-patient with history of lower extremity disease and previous carotid endarterectomy. Add aspirin. 5. History of abdominal aortic aneurysm repair 60746} TIMI Risk Score for ST  Elevation MI:   The patient's TIMI risk score is 6, which indicates a 16.1% risk of all cause mortality at 30 days.       Severity of Illness: The appropriate patient status for this patient is INPATIENT. Inpatient status is judged to be reasonable and necessary in order to provide the required intensity of service to ensure the patient's safety. The patient's presenting symptoms, physical exam findings, and initial radiographic and laboratory data in the context of their chronic comorbidities is felt to place them at high risk for further clinical deterioration. Furthermore, it is not anticipated that the patient will be medically stable for discharge from the hospital within 2  midnights of admission. The following factors support the patient status of inpatient.   " The patient's presenting symptoms include chest pressure. " The worrisome physical exam findings include bruits and diminished BS. " The initial radiographic and laboratory data are worrisome because of Anterior ST elevation. " The chronic co-morbidities include hypertension, COPD, PVD, hyperlipidemia.   * I certify that at the point of admission it is my clinical judgment that the patient will require inpatient hospital care spanning beyond 2 midnights from the point of admission due to high intensity of service, high risk for further deterioration and high frequency of surveillance required.*    For questions or updates, please contact [facility] Please consult [email or web address] for contact info under   Signed, Olga Millers, MD  08/29/2020 11:22 AM  Addendum: Approximately 15 minutes after my initial evaluation in the emergency room the patient suffered a cardiac arrest.  Initial rhythm was ventricular fibrillation.  He was treated with epinephrine 1 mg IV and was defibrillated.  He was placed on IV amiodarone.  He received a total of 2 minutes CPR.  Following event patient is alert and oriented.  Plan to proceed with cardiac catheterization now. Kirk Ruths, MD

## 2020-08-29 NOTE — CV Procedure (Signed)
   Occluded proximal LAD with right to left collaterals.  Tortuous 70 to 80% mid RCA.  Widely patent small circumflex artery.  Widely patent, calcified left main.  Successful CTO type procedure on the proximal LAD converting total occlusion to less than 30% stenosis using 22 x 2.75 Onyx deployed at 12 atm x 2.  Intravascular ultrasound demonstrated heavy plaque burden proximal to the stent but without evidence of dissection and therefore no further stenting was performed

## 2020-08-30 ENCOUNTER — Inpatient Hospital Stay (HOSPITAL_COMMUNITY): Payer: Medicare Other

## 2020-08-30 ENCOUNTER — Encounter (HOSPITAL_COMMUNITY): Payer: Self-pay | Admitting: Interventional Cardiology

## 2020-08-30 DIAGNOSIS — J9601 Acute respiratory failure with hypoxia: Secondary | ICD-10-CM | POA: Diagnosis not present

## 2020-08-30 DIAGNOSIS — I2102 ST elevation (STEMI) myocardial infarction involving left anterior descending coronary artery: Secondary | ICD-10-CM

## 2020-08-30 DIAGNOSIS — I469 Cardiac arrest, cause unspecified: Secondary | ICD-10-CM | POA: Diagnosis not present

## 2020-08-30 LAB — LIPID PANEL
Cholesterol: 153 mg/dL (ref 0–200)
HDL: 45 mg/dL (ref >40)
LDL Cholesterol: 93 mg/dL (ref 0–99)
Total CHOL/HDL Ratio: 3.4 RATIO
Triglycerides: 74 mg/dL (ref <150)
VLDL: 15 mg/dL (ref 0–40)

## 2020-08-30 LAB — HEPARIN LEVEL (UNFRACTIONATED): Heparin Unfractionated: 0.13 IU/mL — ABNORMAL LOW (ref 0.30–0.70)

## 2020-08-30 LAB — BASIC METABOLIC PANEL
Anion gap: 11 (ref 5–15)
BUN: 23 mg/dL (ref 8–23)
CO2: 23 mmol/L (ref 22–32)
Calcium: 8.6 mg/dL — ABNORMAL LOW (ref 8.9–10.3)
Chloride: 110 mmol/L (ref 98–111)
Creatinine, Ser: 1.11 mg/dL (ref 0.61–1.24)
GFR, Estimated: >60 mL/min (ref >60)
Glucose, Bld: 134 mg/dL — ABNORMAL HIGH (ref 70–99)
Potassium: 3.6 mmol/L (ref 3.5–5.1)
Sodium: 144 mmol/L (ref 135–145)

## 2020-08-30 LAB — GLUCOSE, CAPILLARY
Glucose-Capillary: 127 mg/dL — ABNORMAL HIGH (ref 70–99)
Glucose-Capillary: 118 mg/dL — ABNORMAL HIGH (ref 70–99)
Glucose-Capillary: 119 mg/dL — ABNORMAL HIGH (ref 70–99)
Glucose-Capillary: 120 mg/dL — ABNORMAL HIGH (ref 70–99)
Glucose-Capillary: 101 mg/dL — ABNORMAL HIGH (ref 70–99)

## 2020-08-30 LAB — CBC
HCT: 33.6 % — ABNORMAL LOW (ref 39.0–52.0)
Hemoglobin: 11.3 g/dL — ABNORMAL LOW (ref 13.0–17.0)
MCH: 31.2 pg (ref 26.0–34.0)
MCHC: 33.6 g/dL (ref 30.0–36.0)
MCV: 92.8 fL (ref 80.0–100.0)
Platelets: 116 10*3/uL — ABNORMAL LOW (ref 150–400)
RBC: 3.62 MIL/uL — ABNORMAL LOW (ref 4.22–5.81)
RDW: 14.3 % (ref 11.5–15.5)
WBC: 14.9 10*3/uL — ABNORMAL HIGH (ref 4.0–10.5)
nRBC: 0 % (ref 0.0–0.2)

## 2020-08-30 LAB — BRAIN NATRIURETIC PEPTIDE: B Natriuretic Peptide: 1345.1 pg/mL — ABNORMAL HIGH (ref 0.0–100.0)

## 2020-08-30 MED ORDER — HEPARIN SODIUM (PORCINE) 5000 UNIT/ML IJ SOLN
5000.0000 [IU] | Freq: Three times a day (TID) | INTRAMUSCULAR | Status: DC
  Administered 2020-08-30 – 2020-09-12 (×38): 5000 [IU] via SUBCUTANEOUS
  Filled 2020-08-30 (×38): qty 1

## 2020-08-30 MED ORDER — METOPROLOL SUCCINATE ER 100 MG PO TB24
100.0000 mg | ORAL_TABLET | Freq: Every day | ORAL | Status: DC
  Administered 2020-08-30 – 2020-08-31 (×2): 100 mg via ORAL
  Filled 2020-08-30: qty 1
  Filled 2020-08-30: qty 2

## 2020-08-30 MED ORDER — SPIRONOLACTONE 12.5 MG HALF TABLET
12.5000 mg | ORAL_TABLET | Freq: Every day | ORAL | Status: DC
  Administered 2020-08-30 – 2020-08-31 (×2): 12.5 mg via ORAL
  Filled 2020-08-30 (×2): qty 1

## 2020-08-30 MED ORDER — FUROSEMIDE 10 MG/ML IJ SOLN
40.0000 mg | Freq: Once | INTRAMUSCULAR | Status: AC
  Administered 2020-08-30: 40 mg via INTRAVENOUS
  Filled 2020-08-30: qty 4

## 2020-08-30 MED ORDER — IPRATROPIUM-ALBUTEROL 0.5-2.5 (3) MG/3ML IN SOLN: 3.0000 mL | RESPIRATORY_TRACT | Status: DC | PRN

## 2020-08-30 MED ORDER — CHLORHEXIDINE GLUCONATE CLOTH 2 % EX PADS
6.0000 | MEDICATED_PAD | Freq: Every day | CUTANEOUS | Status: DC
  Administered 2020-08-30 – 2020-08-31 (×2): 6 via TOPICAL

## 2020-08-30 MED FILL — Nitroglycerin IV Soln 100 MCG/ML in D5W: INTRA_ARTERIAL | Qty: 10 | Status: AC

## 2020-08-30 NOTE — Plan of Care (Signed)

## 2020-08-30 NOTE — Progress Notes (Signed)
ANTICOAGULATION CONSULT NOTE  Pharmacy Consult for heparin Indication: chest pain/ACS  Heparin Dosing Weight: 53.1 kg  Labs: Recent Labs    08/29/20 1043 08/29/20 1200 08/29/20 1415 08/29/20 1634 08/29/20 1835 08/30/20 0520  HGB 14.1 13.3  --   --   --  11.3*  HCT 45.3 39.0  --   --   --  33.6*  PLT 104*  --   --   --   --  116*  APTT 28  --   --   --   --   --   LABPROT 14.0  --   --   --   --   --   INR 1.1  --   --   --   --   --   HEPARINUNFRC  --   --   --   --   --  0.13*  CREATININE 1.10 0.80  --   --   --  1.11  TROPONINIHS 12,408*  --  >27,000* >27,000* >27,000*  --     Estimated Creatinine Clearance: 34.5 mL/min (by C-G formula based on SCr of 1.11 mg/dL).  Assessment: 50 yom presenting with CP. Pharmacy consulted to dose heparin. Patient is not on anticoagulation PTA. Pt s/p LHC with PCI completed. Pharmacy to resume IV heparin 8h after sheath removal with late presenting MI and risk for apical thrombus.  Initial heparin level subtherapeutic at 0.13, CBC stable. Will defer bolus with recent cath.  Goal of Therapy:  Heparin level 0.3-0.7 units/ml Monitor platelets by anticoagulation protocol: Yes   Plan:  Increase heparin to 850 units/h Recheck heparin level in 8h   Fredonia Highland, PharmD, [place], [facility] Clinical Pharmacist [phone] Please check AMION for all [facility] Pharmacy numbers 08/30/2020

## 2020-08-30 NOTE — Progress Notes (Signed)
Progress Note  Patient Name: John Fields Date of Encounter: 08/30/2020  [facility] Cardiologist: New  Subjective   No CP or dyspnea  Inpatient Medications    Scheduled Meds: . amiodarone  150 mg Intravenous Once  . aspirin EC  81 mg Oral Daily  . busPIRone  15 mg Oral Daily  . Chlorhexidine Gluconate Cloth  6 each Topical Daily  . ezetimibe  10 mg Oral Daily  . feeding supplement  237 mL Oral BID BM  . lisinopril  40 mg Oral Daily  . metoprolol tartrate  50 mg Oral BID  . sodium chloride flush  3 mL Intravenous Q12H  . tamsulosin  0.4 mg Oral Daily  . ticagrelor  90 mg Oral BID   Continuous Infusions: . sodium chloride 20 mL/hr at 08/30/20 0700  . sodium chloride    . amiodarone 30 mg/hr (08/30/20 0700)  . heparin 850 Units/hr (08/30/20 0731)  . nitroGLYCERIN     PRN Meds: sodium chloride, acetaminophen, LORazepam, nitroGLYCERIN, ondansetron (ZOFRAN) IV, oxyCODONE, sodium chloride flush   Vital Signs    Vitals:   08/30/20 0500 08/30/20 0600 08/30/20 0700 08/30/20 0723  BP: (!) 130/51 (!) 113/95    Pulse: (!) 57 76 81   Resp: (!) 23 (!) 31 (!) 24   Temp:    98.1 F (36.7 C)  TempSrc:    Oral  SpO2: 95% 94% 97%   Weight: 53.4 kg     Height:        Intake/Output Summary (Last 24 hours) at 08/30/2020 0748 Last data filed at 08/30/2020 0700 Gross per 24 hour  Intake 1412.25 ml  Output 250 ml  Net 1162.25 ml   Last 3 Weights 08/30/2020 08/29/2020  Weight (lbs) 117 lb 11.6 oz 117 lb  Weight (kg) 53.4 kg 53.071 kg      Telemetry    Sinus with rare PVC- Personally Reviewed   Physical Exam   GEN: No acute distress.  Somewhat frail Neck: No JVD Cardiac: RRR Respiratory: Diminished BS GI: Soft, nontender, non-distended  MS: No edema; radial cath site with no hematoma Neuro:  Nonfocal  Psych: Normal affect   Labs    High Sensitivity Troponin:   Recent Labs  Lab 08/29/20 1043 08/29/20 1415 08/29/20 1634 08/29/20 1835  TROPONINIHS  12,408* >27,000* >27,000* >27,000*      Chemistry Recent Labs  Lab 08/29/20 1043 08/29/20 1200 08/30/20 0520  NA 141 144 144  K 3.8 3.0* 3.6  CL 106 109 110  CO2 23  --  23  GLUCOSE 113* 179* 134*  BUN 20 22 23   CREATININE 1.10 0.80 1.11  CALCIUM 8.9  --  8.6*  PROT 6.2*  --   --   ALBUMIN 3.3*  --   --   AST 91*  --   --   ALT 23  --   --   ALKPHOS 56  --   --   BILITOT 0.8  --   --   GFRNONAA >60  --  >60  ANIONGAP 12  --  11     Hematology Recent Labs  Lab 08/29/20 1043 08/29/20 1200 08/30/20 0520  WBC 9.2  --  14.9*  RBC 4.73  --  3.62*  HGB 14.1 13.3 11.3*  HCT 45.3 39.0 33.6*  MCV 95.8  --  92.8  MCH 29.8  --  31.2  MCHC 31.1  --  33.6  RDW 13.8  --  14.3  PLT 104*  --  116*    Radiology    CARDIAC CATHETERIZATION  Result Date: 08/29/2020  A stent was successfully placed.   Late presenting (greater than 30 hours) anteroapical infarction complicated by ventricular fibrillation in the emergency department after the initial evaluation.  Successful PCI of proximal to mid total occlusion of the LAD reducing the stenosis to 0% with TIMI grade III flow.  There is eccentric 50% stenosis proximal to the stent but without evidence of dissection noted.  The large 1st diagonal was not jailed.  Patent left main  Relatively small but patent circumflex.  Very tortuous right coronary supplying collaterals to the mid and apical LAD via septal perforators.  The mid RCA contains a 70 to 80% stenosis beyond region of significant tortuosity.  Anteroapical akinesis.  LVEDP 9 mmHg.  Acute systolic heart failure with normal filling pressure. Recommendations:  Because of the late presenting nature of the patient's infarction, IV heparin will be resumed.  2D Doppler echocardiogram will be done in a.m. to assess for the presence of apical thrombus.  If none is present heparin can be discontinued.  Continue dual antiplatelet therapy.  Optimize medical therapy to achieve  guideline directed therapy for systolic dysfunction  Aggressive risk factor modification including consideration of PCSK9 therapy for lipid control   DG Chest Port 1 View  Result Date: 08/29/2020 CLINICAL DATA:  Chest pain. EXAM: PORTABLE CHEST 1 VIEW COMPARISON:  03/17/2019. 09/10/2017. Thyroid ultrasound report 07/03/2016. CT chest 06/26/2016. FINDINGS: Large left paratracheal soft tissue density consistent with known thyroid mass. Stable cardiomegaly. No pulmonary venous congestion. Stable mild bilateral interstitial prominence consistent chronic interstitial lung disease. Prominent nipple shadow again noted on the right. No focal infiltrate. Degenerative change thoracic spine. IMPRESSION: 1. Large left paratracheal soft tissue density consistent with known thyroid mass. 2. Stable cardiomegaly. No pulmonary venous congestion. 3. Stable mild bilateral interstitial prominence consistent with chronic interstitial lung disease. No acute infiltrate. Electronically Signed   By: Marcello Moores  Register   On: 08/29/2020 11:15   ECHOCARDIOGRAM COMPLETE  Result Date: 08/29/2020    ECHOCARDIOGRAM REPORT   Patient Name:   John Fields Date of Exam: 08/29/2020 Medical Rec #:  [id number]   Height:       67.0 in Accession #:    [id number]  Weight:       117.0 lb Date of Birth:  1930/03/26  BSA:          1.610 m Patient Age:    86 years    BP:           148/76 mmHg Patient Gender: M           HR:           84 bpm. Exam Location:  Inpatient Procedure: 2D Echo Indications:    NSTEMI I21.4  History:        Patient has no prior history of Echocardiogram examinations.                 Previous Myocardial Infarction, COPD; Risk Factors:Former                 Smoker, Hypertension and Dyslipidemia.  Sonographer:    Leavy Cella Referring Phys: [id number] Leanor Kail IMPRESSIONS  1. Left ventricular ejection fraction, by estimation, is 35 to 40%. The left ventricle has moderately decreased function. The left ventricle  demonstrates regional wall motion abnormalities (see scoring diagram/findings for description). Left ventricular  diastolic parameters are consistent with Grade I diastolic dysfunction (impaired relaxation). Elevated left  atrial pressure.  2. Right ventricular systolic function is normal. The right ventricular size is normal. There is mildly elevated pulmonary artery systolic pressure. The estimated right ventricular systolic pressure is [id number] mmHg.  3. The mitral valve is normal in structure. Mild mitral valve regurgitation. No evidence of mitral stenosis.  4. The aortic valve is tricuspid. Aortic valve regurgitation is not visualized. Mild aortic valve sclerosis is present, with no evidence of aortic valve stenosis.  5. The inferior vena cava is normal in size with greater than 50% respiratory variability, suggesting right atrial pressure of 3 mmHg. FINDINGS  Left Ventricle: Left ventricular ejection fraction, by estimation, is 35 to 40%. The left ventricle has moderately decreased function. The left ventricle demonstrates regional wall motion abnormalities. The left ventricular internal cavity size was normal in size. There is borderline left ventricular hypertrophy. Left ventricular diastolic parameters are consistent with Grade I diastolic dysfunction (impaired relaxation). Elevated left atrial pressure.  LV Wall Scoring: The entire apex is dyskinetic. The mid anteroseptal segment and mid anterior segment are akinetic. The mid anterolateral segment is hypokinetic. The inferior wall, posterior wall, basal anteroseptal segment, basal anterolateral segment, mid inferoseptal segment, basal anterior segment, and basal inferoseptal segment are normal. Right Ventricle: The right ventricular size is normal. No increase in right ventricular wall thickness. Right ventricular systolic function is normal. There is mildly elevated pulmonary artery systolic pressure. The tricuspid regurgitant velocity is 2.89  m/s, and with  an assumed right atrial pressure of 3 mmHg, the estimated right ventricular systolic pressure is [id number] mmHg. Left Atrium: Left atrial size was normal in size. Right Atrium: Right atrial size was normal in size. Pericardium: There is no evidence of pericardial effusion. Mitral Valve: The mitral valve is normal in structure. Mild to moderate mitral annular calcification. Mild mitral valve regurgitation. No evidence of mitral valve stenosis. Tricuspid Valve: The tricuspid valve is normal in structure. Tricuspid valve regurgitation is mild. Aortic Valve: The aortic valve is tricuspid. Aortic valve regurgitation is not visualized. Mild aortic valve sclerosis is present, with no evidence of aortic valve stenosis. Pulmonic Valve: The pulmonic valve was not well visualized. Pulmonic valve regurgitation is not visualized. Aorta: The aortic root is normal in size and structure. Venous: The inferior vena cava is normal in size with greater than 50% respiratory variability, suggesting right atrial pressure of 3 mmHg. IAS/Shunts: The interatrial septum was not well visualized.  LEFT VENTRICLE PLAX 2D LVIDd:         3.70 cm  Diastology LVIDs:         2.60 cm  LV e' lateral:   5.22 cm/s LV PW:         1.20 cm  LV E/e' lateral: 5.6 LV IVS:        1.10 cm LVOT diam:     2.00 cm LVOT Area:     3.14 cm  LEFT ATRIUM             Index       RIGHT ATRIUM           Index LA diam:        3.00 cm 1.86 cm/m  RA Area:     11.90 cm LA Vol (A2C):   26.4 ml 16.40 ml/m RA Volume:   31.80 ml  19.75 ml/m LA Vol (A4C):   16.2 ml 10.06 ml/m LA Biplane Vol: 22.6 ml 14.04 ml/m   AORTA Ao Root diam: 3.30 cm MITRAL VALVE  TRICUSPID VALVE MV Area (PHT): 2.93 cm    TR Peak grad:   33.4 mmHg MV Decel Time: 259 msec    TR Vmax:        289.00 cm/s MV E velocity: 29.00 cm/s MV A velocity: 68.00 cm/s  SHUNTS MV E/A ratio:  0.43        Systemic Diam: 2.00 cm Rachelle Hora Croitoru MD Electronically signed by Thurmon Fair MD Signature Date/Time:  08/29/2020/4:30:49 PM    Final     Patient Profile     86 y.o. male admitted with acute anterior myocardial infarction (late presentation). Patient suffered ventricular fibrillation arrest in the emergency room requiring transient CPR and defibrillation. Cardiac catheterization revealed occluded LAD and 70% right coronary artery. Left ventricular end-diastolic pressure 9 mmHg. Patient had PCI of the LAD. Echocardiogram showed ejection fraction 35 to 40%, grade 1 diastolic dysfunction, mild mitral regurgitation.  Assessment & Plan    1. Acute anterior ST elevation myocardial infarction-patient is status post PCI of LAD.  Plan to continue aspirin and Brilinta.  Patient is intolerant to statins.  Echocardiogram shows moderate LV dysfunction.  Continue lisinopril.  Change metoprolol to Toprol 100 mg daily. Add spironolactone 12.5 mg daily. Discontinue IV heparin.  Patient suffered a cardiac arrest in the emergency room but this occurred in the setting of acute myocardial infarction.  Discontinue IV amiodarone. 2. Hypertension-continue lisinopril and Toprol.  Follow blood pressure and adjust regimen as needed. 3. Hyperlipidemia-patient is intolerant to statins.   Continue Zetia.  Check lipids and liver 12 weeks after discharge.  If LDL greater than 70 would consider Repatha. 4. Peripheral vascular disease-patient with history of lower extremity disease and previous carotid endarterectomy.  Continue aspirin and Zetia. 5. History of abdominal aortic aneurysm repair 6. Thyroid density-noted on chest x-ray-follow-up primary care.  We will transfer to telemetry and begin ambulation.   For questions or updates, please contact [facility] Please consult [email or web address] for contact info under        Signed, Olga Millers, MD  08/30/2020, 7:48 AM

## 2020-08-30 NOTE — Progress Notes (Signed)
Pt with significant SOB in bed. RN on 2H sts he sat in chair earlier today but with significant dyspnea since. On 5L in bed, SaO2 100, breathless. Just transferred and wants to rest. Encouraged chair later today. Discussed MI, stent, Brilinta. Will f/u tomorrow. [id number] Ethelda Chick CES, ACSM 1:23 PM 08/30/2020

## 2020-08-30 NOTE — Progress Notes (Signed)
Spoke with primary RN regarding PIV access. RN reported the MD d/c'd current IV infusions and no need for a PIV at this time.

## 2020-08-30 NOTE — Progress Notes (Signed)
Initial Nutrition Assessment  DOCUMENTATION CODES:   Severe malnutrition in context of chronic illness,Underweight  INTERVENTION:   Ensure Enlive po BID, each supplement provides 350 kcal and 20 grams of protein  NUTRITION DIAGNOSIS:   Severe Malnutrition related to chronic illness as evidenced by severe fat depletion,severe muscle depletion.  GOAL:   Patient will meet greater than or equal to 90% of their needs   MONITOR:   PO intake,Supplement acceptance,Labs,Weight trends  REASON FOR ASSESSMENT:   Malnutrition Screening Tool    ASSESSMENT:   86 yo male admitted with acute STEMI. PMH includes AAA, CAD, COPD, HTN, HLD, PVD   12/28 PCI to LAD lesion  Recorded po intake 40% at breakfast this AM. Pt eating grilled cheese sandwich and sipping on Ensure. Pt reports he usually drinks 1 Ensure daily at home. Pt lives alone and does not really cook; typically eats at restaurants for his meals, like the [employer] in [place], and eats 2 meals per day. Pt eats snacks at home as well  Pt reports several years ago UBW around 155 pounds. Current wt 126 pounds, admit weight 118 pounds. Pt reports he weighs around 120 pounds with clothes on, 113 pounds without clothes. No previous weight encounters  Pt denies problems chewing but does indicate some swallowing issues. Pt reports he had surgery on his neck/throat and sometimes food gets stuck but he is able to cough it up.   Labs: reviewed Meds: reviewed    NUTRITION - FOCUSED PHYSICAL EXAM:  Flowsheet Row Most Recent Value  Orbital Region Moderate depletion  Upper Arm Region Severe depletion  Thoracic and Lumbar Region Severe depletion  Buccal Region Moderate depletion  Temple Region Moderate depletion  Clavicle Bone Region Severe depletion  Clavicle and Acromion Bone Region Severe depletion  Scapular Bone Region Severe depletion  Dorsal Hand Moderate depletion  Patellar Region Severe depletion  Anterior Thigh Region Severe  depletion  Posterior Calf Region Severe depletion       Diet Order:   Diet Order            Diet Heart Room service appropriate? Yes; Fluid consistency: Thin  Diet effective now                 EDUCATION NEEDS:   Education needs have been addressed  Skin:  Skin Assessment: Reviewed RN Assessment  Last BM:  12/26  Height:   Ht Readings from Last 1 Encounters:  08/30/20 5\' 7"  (1.702 m)    Weight:   Wt Readings from Last 1 Encounters:  08/30/20 57 kg      BMI:  Body mass index is 19.69 kg/m.  Estimated Nutritional Needs:   Kcal:  1700-1900 kcals  Protein:  85-95 g  Fluid:  >/= 1.7 L   09/01/20 MS, RDN, LDN, CNSC Registered Dietitian III Clinical Nutrition RD Pager and On-Call Pager Number Located in [place]

## 2020-08-30 NOTE — Progress Notes (Signed)
   08/30/20 1000  Clinical Encounter Type  Visited With Health care provider;Patient  Visit Type Initial  Referral From Nurse  Consult/Referral To Chaplain  Spiritual Encounters  Spiritual Needs Literature  The chaplain spoke with patient about an AD. The patient will review the paperwork but did not seem to know what I was speaking about. The chaplain left the AD paperwork for the the patient to review. The chaplain will follow up as needed.

## 2020-08-30 NOTE — Progress Notes (Signed)
NAME:  John Fields, MRN:  [id number], DOB:  10-03-1929, LOS: 1 ADMISSION DATE:  08/29/2020, CONSULTATION DATE:  08/29/2020 REFERRING MD:  Linard Millers - [facility], CHIEF COMPLAINT:  Hypoxia following cardiac arrest.    HPI/course in hospital  86 year old vasculopath presented with new onset RSCP without radiation or dyspnea, intermittently since 12/26 without clear relation to exertion.  Persistent chest pain since 0100. In ED, EKG showed anterior STEMI ( TIMI score 6). Suffered brief arrest in ED per cardiology (no documentation). Brought to cath lab for emergent revascularization.  Review of recent medical history: AAA repaired EVAR 2010, chronic diarrhea, possibly statin related. PVD. COPD by history - not on home O2, no pulmonary follow-up, no recent PFTs.  Quit smoking several years ago.   Past Medical History   Past Medical History:  Diagnosis Date  . Abdominal aortic aneurysm (AAA) ([place])   . Anxiety   . Carotid artery disease ([place])   . COPD (chronic obstructive pulmonary disease) ([place])   . Hyperlipidemia   . Hypertension   . Peripheral vascular disease ([place])   . Prostate cancer [facility])      Past Surgical History:  Procedure Laterality Date  . ABDOMINAL AORTIC ANEURYSM REPAIR    . BACK SURGERY    . CAROTID ENDARTERECTOMY    . Cataract surgery    . CORONARY STENT INTERVENTION N/A 08/29/2020   Procedure: CORONARY STENT INTERVENTION;  Surgeon: Belva Crome, MD;  Location: [place] [facility];  Service: Cardiovascular;  Laterality: N/A;  . CORONARY/GRAFT ACUTE MI REVASCULARIZATION N/A 08/29/2020   Procedure: Coronary/Graft Acute MI Revascularization;  Surgeon: Belva Crome, MD;  Location: [place] [facility];  Service: Cardiovascular;  Laterality: N/A;  . LEFT HEART CATH AND CORONARY ANGIOGRAPHY N/A 08/29/2020   Procedure: LEFT HEART CATH AND CORONARY ANGIOGRAPHY;  Surgeon: Belva Crome, MD;  Location: [place] [facility];  Service: Cardiovascular;  Laterality: N/A;       Interim history/subjective:  Uneventful PCI to proximal LAD lesion.  LVEDP 9.  Denies chest pain or shortness of breath today  Objective   Blood pressure (!) 153/72, pulse 68, temperature 98.1 F (36.7 C), temperature source Oral, resp. rate 18, height 5\' 7"  (1.702 m), weight 53.4 kg, SpO2 97 %.        Intake/Output Summary (Last 24 hours) at 08/30/2020 0948 Last data filed at 08/30/2020 0700 Gross per 24 hour  Intake 1412.25 ml  Output 250 ml  Net 1162.25 ml   Filed Weights   08/29/20 1045 08/30/20 0500  Weight: 53.1 kg 53.4 kg    Examination:  Physical Exam Constitutional:      Appearance: He is underweight.  HENT:     Head: Normocephalic.  Eyes:     Extraocular Movements: Extraocular movements intact.     Pupils: Pupils are equal, round, and reactive to light.  Cardiovascular:     Rate and Rhythm: Normal rate and regular rhythm.      JVP visible in the sitting position    Chest Wall: PMI is not displaced.     Heart sounds: Normal heart sounds. No S3 or S4 sounds.  No rub Pulmonary:     Effort: Mild tachypnea    Breath sounds: Crackles and wheezing at both bases. Abdominal:     Palpations: Abdomen is soft.  Skin:    General: Skin is warm and dry.     Capillary Refill: Capillary refill takes 2 to 3 seconds.  Neurological:  General: No focal deficit present.     Mental Status: He is alert.     Ancillary tests (personally reviewed)  CBC: Recent Labs  Lab 08/29/20 1043 08/29/20 1200 08/30/20 0520  WBC 9.2  --  14.9*  NEUTROABS 6.7  --   --   HGB 14.1 13.3 11.3*  HCT 45.3 39.0 33.6*  MCV 95.8  --  92.8  PLT 104*  --  116*    Basic Metabolic Panel: Recent Labs  Lab 08/29/20 1043 08/29/20 1200 08/30/20 0520  NA 141 144 144  K 3.8 3.0* 3.6  CL 106 109 110  CO2 23  --  23  GLUCOSE 113* 179* 134*  BUN 20 22 23   CREATININE 1.10 0.80 1.11  CALCIUM 8.9  --  8.6*   GFR: Estimated Creatinine Clearance: 34.7 mL/min (by C-G formula based on SCr  of 1.11 mg/dL). Recent Labs  Lab 08/29/20 1043 08/30/20 0520  WBC 9.2 14.9*    Liver Function Tests: Recent Labs  Lab 08/29/20 1043  AST 91*  ALT 23  ALKPHOS 56  BILITOT 0.8  PROT 6.2*  ALBUMIN 3.3*   No results for input(s): LIPASE, AMYLASE in the last 168 hours. No results for input(s): AMMONIA in the last 168 hours.  ABG    Component Value Date/Time   TCO2 22 08/29/2020 1200     Coagulation Profile: Recent Labs  Lab 08/29/20 1043  INR 1.1    Cardiac Enzymes: No results for input(s): CKTOTAL, CKMB, CKMBINDEX, TROPONINI in the last 168 hours.  HbA1C: Hgb A1c MFr Bld  Date/Time Value Ref Range Status  08/29/2020 10:43 AM 5.3 4.8 - 5.6 % Final    Comment:    (NOTE) Pre diabetes:          5.7%-6.4%  Diabetes:              >6.4%  Glycemic control for   <7.0% adults with diabetes     CBG: Recent Labs  Lab 08/29/20 1705 08/29/20 1956 08/29/20 2316 08/30/20 0323 08/30/20 0720  GLUCAP 167* 185* 194* 127* 118*    Chest x-ray 12/28 (personally reviewed): Hyperinflated lung fields bilaterally.  No evidence of pulmonary edema.  Assessment & Plan:   Critically ill due to acute ST elevation anterior wall myocardial infarction [place]ip class I Acute hypoxic respiratory insufficiency Status post VF cardiac arrest likely ischemic in origin. Calcified coronary artery disease Peripheral vascular disease Status post EVAR repair of AAA COPD by history Possible microscopic colitis.  Plan:  Continues to have increased oxygen needs this morning with mild dyspnea. -Will obtain chest x-ray -Start DuoNeb -BNP to help guide fluid management. -While he appeared dry yesterday hypoxia at this time may be due to either atelectasis or volume overload.  May require diuretics. -Start incentive spirometry. -Continue progressive ambulation -Continue secondary cardiac prevention -May require long-term anticoagulation given anterior wall motion abnormality.  Daily  Goals Checklist  Pain/Anxiety/Delirium protocol (if indicated): Tylenol PM VAP protocol (if indicated): Not intubated Respiratory support goals: Wean oxygen as tolerated.  Incentive spirometry. Blood pressure target: Systolic blood pressure less than 140 DVT prophylaxis: On systemic heparin  Nutritional status and feeding goals: Progressive cardiac diet GI prophylaxis: Not indicated Fluid status goals: IV hydration 6 hours post Urinary catheter: Not required Central lines: PIV's only Glucose control: No history of diabetes, monitor only. Mobility/therapy needs: Phase 1 cardiac Antibiotic de-escalation: No antibiotic Home medication reconciliation: Home medications reconciled Daily labs: BMP daily Code Status: Full code Family Communication: Cardiology  to update family Disposition: To ICU   Lynnell Catalan, MD [facility] ICU Physician [facility]  Pager: [phone] Mobile: [phone] After hours: [phone].  08/30/2020, 9:48 AM

## 2020-08-31 DIAGNOSIS — I469 Cardiac arrest, cause unspecified: Secondary | ICD-10-CM | POA: Diagnosis not present

## 2020-08-31 DIAGNOSIS — E43 Unspecified severe protein-calorie malnutrition: Secondary | ICD-10-CM | POA: Insufficient documentation

## 2020-08-31 DIAGNOSIS — J9601 Acute respiratory failure with hypoxia: Secondary | ICD-10-CM | POA: Diagnosis not present

## 2020-08-31 DIAGNOSIS — I2102 ST elevation (STEMI) myocardial infarction involving left anterior descending coronary artery: Secondary | ICD-10-CM | POA: Diagnosis not present

## 2020-08-31 LAB — GLUCOSE, CAPILLARY
Glucose-Capillary: 105 mg/dL — ABNORMAL HIGH (ref 70–99)
Glucose-Capillary: 135 mg/dL — ABNORMAL HIGH (ref 70–99)
Glucose-Capillary: 110 mg/dL — ABNORMAL HIGH (ref 70–99)
Glucose-Capillary: 151 mg/dL — ABNORMAL HIGH (ref 70–99)

## 2020-08-31 LAB — CBC
HCT: 29.2 % — ABNORMAL LOW (ref 39.0–52.0)
Hemoglobin: 9.1 g/dL — ABNORMAL LOW (ref 13.0–17.0)
MCH: 30.1 pg (ref 26.0–34.0)
MCHC: 31.2 g/dL (ref 30.0–36.0)
MCV: 96.7 fL (ref 80.0–100.0)
Platelets: 103 10*3/uL — ABNORMAL LOW (ref 150–400)
RBC: 3.02 MIL/uL — ABNORMAL LOW (ref 4.22–5.81)
RDW: 14.6 % (ref 11.5–15.5)
WBC: 9.3 10*3/uL (ref 4.0–10.5)
nRBC: 0 % (ref 0.0–0.2)

## 2020-08-31 LAB — BASIC METABOLIC PANEL
Anion gap: 9 (ref 5–15)
BUN: 32 mg/dL — ABNORMAL HIGH (ref 8–23)
CO2: 25 mmol/L (ref 22–32)
Calcium: 8.8 mg/dL — ABNORMAL LOW (ref 8.9–10.3)
Chloride: 109 mmol/L (ref 98–111)
Creatinine, Ser: 1.24 mg/dL (ref 0.61–1.24)
GFR, Estimated: 56 mL/min — ABNORMAL LOW (ref >60)
Glucose, Bld: 105 mg/dL — ABNORMAL HIGH (ref 70–99)
Potassium: 3.5 mmol/L (ref 3.5–5.1)
Sodium: 143 mmol/L (ref 135–145)

## 2020-08-31 MED ORDER — METHYLPREDNISOLONE SODIUM SUCC 40 MG IJ SOLR
40.0000 mg | Freq: Every day | INTRAMUSCULAR | Status: DC
  Administered 2020-08-31 – 2020-09-01 (×2): 40 mg via INTRAVENOUS
  Filled 2020-08-31 (×2): qty 1

## 2020-08-31 MED ORDER — ALBUTEROL SULFATE (2.5 MG/3ML) 0.083% IN NEBU: 2.5000 mg | INHALATION_SOLUTION | RESPIRATORY_TRACT | Status: DC | PRN

## 2020-08-31 MED ORDER — LOSARTAN POTASSIUM 50 MG PO TABS
100.0000 mg | ORAL_TABLET | Freq: Every day | ORAL | Status: DC
  Administered 2020-08-31: 100 mg via ORAL
  Filled 2020-08-31: qty 2

## 2020-08-31 MED ORDER — FUROSEMIDE 10 MG/ML IJ SOLN
40.0000 mg | Freq: Once | INTRAMUSCULAR | Status: AC
  Administered 2020-08-31: 40 mg via INTRAVENOUS
  Filled 2020-08-31: qty 4

## 2020-08-31 MED ORDER — LOSARTAN POTASSIUM 50 MG PO TABS: 50.0000 mg | ORAL_TABLET | Freq: Every day | ORAL | Status: DC

## 2020-08-31 MED ORDER — IPRATROPIUM-ALBUTEROL 0.5-2.5 (3) MG/3ML IN SOLN
3.0000 mL | Freq: Four times a day (QID) | RESPIRATORY_TRACT | Status: DC
  Administered 2020-08-31 – 2020-09-01 (×4): 3 mL via RESPIRATORY_TRACT
  Filled 2020-08-31 (×4): qty 3

## 2020-08-31 MED ORDER — METOPROLOL SUCCINATE ER 50 MG PO TB24: 50.0000 mg | ORAL_TABLET | Freq: Every day | ORAL | Status: DC

## 2020-08-31 NOTE — Progress Notes (Signed)
CARDIAC REHAB PHASE I   PRE:  Rate/Rhythm: 74 SR PVC  BP:  Supine: 104/54  Sitting: 123/63  Standing:    SaO2: 98% 5-6L  MODE:  Ambulation: chair ft   POST:  Rate/Rhythm: 98 SR  BP:  Supine:   Sitting: 120/70  Standing:    SaO2: 92% 4L 1114-1200 Pt stated his head felt funny and he did not know if he could get OOB. Stated he was anxious earlier. Encouraged pt to get to recliner. Took BP sitting at 123/63. Pt stood and pivoted to recliner. Chair alarm on. Turned oxygen to 4L and maintained 92%. Pt stated his head felt better sitting up. Tried to lower oxygen more but sats did not maintain so left on 4. RN in to see pt and pt a little more relaxed so we tried 3L. Pt knows to use purse lip breathing as he stated "smell the roses and blow the candles out'. Pt would benefit from PT consult as he was living by himself. He is very deconditioned. Reinforced importance of taking his brilinta with stent. He stated daughter helps organize his meds. Put in referral for CRP 2 [place] to meet protocol for MI/stent but not appropriate at this time. Will continue to follow pt.   Luetta Nutting, RN BSN  08/31/2020 11:52 AM

## 2020-08-31 NOTE — Progress Notes (Addendum)
Paged by nurse for patient's breathing status. Seen by Critical care team this morning and recommended weaning off on oxygen.   He was given lasix this morning but no improvement on breathing.   Upon arrival to patient room, he was sitting up right and using accessory muscle. Oxygen saturation was 92% on 3L oxygen however with minimal coversation he was dropping to 80-86%. Pt was only speaking soft short sentences.   On exam: normal rate and rhythm. Lung with crackles and wheezing on bases His BP was in 90s systolically.   Breathing improved on 4 L oxygen.   Discussed with Dr. Jens Som. Will avoid additional lasix given soft blood pressure.  Suspect symptoms due to COPD +/- CHF however LVEDP was  9 mmHg during cath.   Reduce losartan to 50mg  qd and Toprol XL to 50mg  daily for tomorrow. Follow closely.   Will update to patient status to critical care team as well.

## 2020-08-31 NOTE — Progress Notes (Signed)
Progress Note  Patient Name: John Fields Date of Encounter: 08/31/2020  [facility] Cardiologist: New  Subjective   No chest pain; short of breath earlier now improved.  Inpatient Medications    Scheduled Meds: . aspirin EC  81 mg Oral Daily  . busPIRone  15 mg Oral Daily  . Chlorhexidine Gluconate Cloth  6 each Topical Daily  . ezetimibe  10 mg Oral Daily  . feeding supplement  237 mL Oral BID BM  . heparin injection (subcutaneous)  5,000 Units Subcutaneous Q8H  . lisinopril  40 mg Oral Daily  . metoprolol succinate  100 mg Oral Daily  . sodium chloride flush  3 mL Intravenous Q12H  . spironolactone  12.5 mg Oral Daily  . tamsulosin  0.4 mg Oral Daily  . ticagrelor  90 mg Oral BID   Continuous Infusions: . sodium chloride Stopped (08/30/20 0817)  . sodium chloride     PRN Meds: sodium chloride, acetaminophen, ipratropium-albuterol, LORazepam, nitroGLYCERIN, ondansetron (ZOFRAN) IV, oxyCODONE, sodium chloride flush   Vital Signs    Vitals:   08/31/20 0052 08/31/20 0540 08/31/20 0545 08/31/20 0817  BP: (!) 147/64 (!) 157/66  121/65  Pulse: 70 78 74 77  Resp: (!) 22 20  20   Temp: 98.4 F (36.9 C) 98.5 F (36.9 C)  98.3 F (36.8 C)  TempSrc: Oral Oral  Oral  SpO2: 99% 96% 96% 95%  Weight:   56.8 kg   Height:        Intake/Output Summary (Last 24 hours) at 08/31/2020 0827 Last data filed at 08/30/2020 0900 Gross per 24 hour  Intake 200 ml  Output --  Net 200 ml   Last 3 Weights 08/31/2020 08/30/2020 08/30/2020  Weight (lbs) 125 lb 3.5 oz 125 lb 11.2 oz 117 lb 11.6 oz  Weight (kg) 56.8 kg 57.017 kg 53.4 kg      Telemetry    Sinus- Personally Reviewed   Physical Exam   GEN: NAD Neck: No JVD, supple Cardiac: RRR, no murmur Respiratory: Diminished BS; no wheeze GI: Soft, NT/ND MS: No edema; radial cath site with no hematoma Neuro:  Grossly intact Psych: Normal affect   Labs    High Sensitivity Troponin:   Recent Labs  Lab  08/29/20 1043 08/29/20 1415 08/29/20 1634 08/29/20 1835  TROPONINIHS 12,408* >27,000* >27,000* >27,000*      Chemistry Recent Labs  Lab 08/29/20 1043 08/29/20 1200 08/30/20 0520 08/31/20 0145  NA 141 144 144 143  K 3.8 3.0* 3.6 3.5  CL 106 109 110 109  CO2 23  --  23 25  GLUCOSE 113* 179* 134* 105*  BUN 20 22 23  32*  CREATININE 1.10 0.80 1.11 1.24  CALCIUM 8.9  --  8.6* 8.8*  PROT 6.2*  --   --   --   ALBUMIN 3.3*  --   --   --   AST 91*  --   --   --   ALT 23  --   --   --   ALKPHOS 56  --   --   --   BILITOT 0.8  --   --   --   GFRNONAA >60  --  >60 56*  ANIONGAP 12  --  11 9     Hematology Recent Labs  Lab 08/29/20 1043 08/29/20 1200 08/30/20 0520 08/31/20 0145  WBC 9.2  --  14.9* 9.3  RBC 4.73  --  3.62* 3.02*  HGB 14.1 13.3 11.3* 9.1*  HCT  45.3 39.0 33.6* 29.2*  MCV 95.8  --  92.8 96.7  MCH 29.8  --  31.2 30.1  MCHC 31.1  --  33.6 31.2  RDW 13.8  --  14.3 14.6  PLT 104*  --  116* 103*    Radiology    CARDIAC CATHETERIZATION  Result Date: 08/29/2020  A stent was successfully placed.   Late presenting (greater than 30 hours) anteroapical infarction complicated by ventricular fibrillation in the emergency department after the initial evaluation.  Successful PCI of proximal to mid total occlusion of the LAD reducing the stenosis to 0% with TIMI grade III flow.  There is eccentric 50% stenosis proximal to the stent but without evidence of dissection noted.  The large 1st diagonal was not jailed.  Patent left main  Relatively small but patent circumflex.  Very tortuous right coronary supplying collaterals to the mid and apical LAD via septal perforators.  The mid RCA contains a 70 to 80% stenosis beyond region of significant tortuosity.  Anteroapical akinesis.  LVEDP 9 mmHg.  Acute systolic heart failure with normal filling pressure. Recommendations:  Because of the late presenting nature of the patient's infarction, IV heparin will be resumed.  2D  Doppler echocardiogram will be done in a.m. to assess for the presence of apical thrombus.  If none is present heparin can be discontinued.  Continue dual antiplatelet therapy.  Optimize medical therapy to achieve guideline directed therapy for systolic dysfunction  Aggressive risk factor modification including consideration of PCSK9 therapy for lipid control   DG CHEST PORT 1 VIEW  Result Date: 08/30/2020 CLINICAL DATA:  Shortness of breath. EXAM: PORTABLE CHEST 1 VIEW COMPARISON:  Chest x-ray from yesterday. FINDINGS: Normal heart size. Unchanged right upper tracheal deviation due to known underlying thyroid goiter. Progressive mild diffuse interstitial thickening, most prominent at the right greater than left lung bases. Probable small right pleural effusion. No pneumothorax. Emphysema. No acute osseous abnormality. IMPRESSION: 1. New mild interstitial pulmonary edema and probable small right pleural effusion. 2. COPD. Electronically Signed   By: Titus Dubin M.D.   On: 08/30/2020 10:13   DG Chest Port 1 View  Result Date: 08/29/2020 CLINICAL DATA:  Chest pain. EXAM: PORTABLE CHEST 1 VIEW COMPARISON:  03/17/2019. 09/10/2017. Thyroid ultrasound report 07/03/2016. CT chest 06/26/2016. FINDINGS: Large left paratracheal soft tissue density consistent with known thyroid mass. Stable cardiomegaly. No pulmonary venous congestion. Stable mild bilateral interstitial prominence consistent chronic interstitial lung disease. Prominent nipple shadow again noted on the right. No focal infiltrate. Degenerative change thoracic spine. IMPRESSION: 1. Large left paratracheal soft tissue density consistent with known thyroid mass. 2. Stable cardiomegaly. No pulmonary venous congestion. 3. Stable mild bilateral interstitial prominence consistent with chronic interstitial lung disease. No acute infiltrate. Electronically Signed   By: Marcello Moores  Register   On: 08/29/2020 11:15   ECHOCARDIOGRAM COMPLETE  Result Date:  08/29/2020    ECHOCARDIOGRAM REPORT   Patient Name:   John Fields Date of Exam: 08/29/2020 Medical Rec #:  [id number]   Height:       67.0 in Accession #:    [id number]  Weight:       117.0 lb Date of Birth:  Jan 15, 1930  BSA:          1.610 m Patient Age:    5 years    BP:           148/76 mmHg Patient Gender: M           HR:  84 bpm. Exam Location:  Inpatient Procedure: 2D Echo Indications:    NSTEMI I21.4  History:        Patient has no prior history of Echocardiogram examinations.                 Previous Myocardial Infarction, COPD; Risk Factors:Former                 Smoker, Hypertension and Dyslipidemia.  Sonographer:    [name] Referring Phys: [id number] Manson Passey IMPRESSIONS  1. Left ventricular ejection fraction, by estimation, is 35 to 40%. The left ventricle has moderately decreased function. The left ventricle demonstrates regional wall motion abnormalities (see scoring diagram/findings for description). Left ventricular  diastolic parameters are consistent with Grade I diastolic dysfunction (impaired relaxation). Elevated left atrial pressure.  2. Right ventricular systolic function is normal. The right ventricular size is normal. There is mildly elevated pulmonary artery systolic pressure. The estimated right ventricular systolic pressure is 36.4 mmHg.  3. The mitral valve is normal in structure. Mild mitral valve regurgitation. No evidence of mitral stenosis.  4. The aortic valve is tricuspid. Aortic valve regurgitation is not visualized. Mild aortic valve sclerosis is present, with no evidence of aortic valve stenosis.  5. The inferior vena cava is normal in size with greater than 50% respiratory variability, suggesting right atrial pressure of 3 mmHg. FINDINGS  Left Ventricle: Left ventricular ejection fraction, by estimation, is 35 to 40%. The left ventricle has moderately decreased function. The left ventricle demonstrates regional wall motion abnormalities. The left  ventricular internal cavity size was normal in size. There is borderline left ventricular hypertrophy. Left ventricular diastolic parameters are consistent with Grade I diastolic dysfunction (impaired relaxation). Elevated left atrial pressure.  LV Wall Scoring: The entire apex is dyskinetic. The mid anteroseptal segment and mid anterior segment are akinetic. The mid anterolateral segment is hypokinetic. The inferior wall, posterior wall, basal anteroseptal segment, basal anterolateral segment, mid inferoseptal segment, basal anterior segment, and basal inferoseptal segment are normal. Right Ventricle: The right ventricular size is normal. No increase in right ventricular wall thickness. Right ventricular systolic function is normal. There is mildly elevated pulmonary artery systolic pressure. The tricuspid regurgitant velocity is 2.89  m/s, and with an assumed right atrial pressure of 3 mmHg, the estimated right ventricular systolic pressure is 36.4 mmHg. Left Atrium: Left atrial size was normal in size. Right Atrium: Right atrial size was normal in size. Pericardium: There is no evidence of pericardial effusion. Mitral Valve: The mitral valve is normal in structure. Mild to moderate mitral annular calcification. Mild mitral valve regurgitation. No evidence of mitral valve stenosis. Tricuspid Valve: The tricuspid valve is normal in structure. Tricuspid valve regurgitation is mild. Aortic Valve: The aortic valve is tricuspid. Aortic valve regurgitation is not visualized. Mild aortic valve sclerosis is present, with no evidence of aortic valve stenosis. Pulmonic Valve: The pulmonic valve was not well visualized. Pulmonic valve regurgitation is not visualized. Aorta: The aortic root is normal in size and structure. Venous: The inferior vena cava is normal in size with greater than 50% respiratory variability, suggesting right atrial pressure of 3 mmHg. IAS/Shunts: The interatrial septum was not well visualized.  LEFT  VENTRICLE PLAX 2D LVIDd:         3.70 cm  Diastology LVIDs:         2.60 cm  LV e' lateral:   5.22 cm/s LV PW:         1.20 cm  LV  E/e' lateral: 5.6 LV IVS:        1.10 cm LVOT diam:     2.00 cm LVOT Area:     3.14 cm  LEFT ATRIUM             Index       RIGHT ATRIUM           Index LA diam:        3.00 cm 1.86 cm/m  RA Area:     11.90 cm LA Vol (A2C):   26.4 ml 16.40 ml/m RA Volume:   31.80 ml  19.75 ml/m LA Vol (A4C):   16.2 ml 10.06 ml/m LA Biplane Vol: 22.6 ml 14.04 ml/m   AORTA Ao Root diam: 3.30 cm MITRAL VALVE               TRICUSPID VALVE MV Area (PHT): 2.93 cm    TR Peak grad:   33.4 mmHg MV Decel Time: 259 msec    TR Vmax:        289.00 cm/s MV E velocity: 29.00 cm/s MV A velocity: 68.00 cm/s  SHUNTS MV E/A ratio:  0.43        Systemic Diam: 2.00 cm Dani Gobble Croitoru MD Electronically signed by Sanda Klein MD Signature Date/Time: 08/29/2020/4:30:49 PM    Final     Patient Profile     86 y.o. male admitted with acute anterior myocardial infarction (late presentation). Patient suffered ventricular fibrillation arrest in the emergency room requiring transient CPR and defibrillation. Cardiac catheterization revealed occluded LAD and 70% right coronary artery. Left ventricular end-diastolic pressure 9 mmHg. Patient had PCI of the LAD. Echocardiogram showed ejection fraction 35 to [id number], grade 1 diastolic dysfunction, mild mitral regurgitation.  Assessment & Plan    1. Acute anterior ST elevation myocardial infarction-patient remains pain-free status post PCI of LAD.  Continue aspirin and Brilinta.  Intolerant to statins.  Pt dyspneic this AM and chest x-ray showed mild CHF.  Will give Lasix 40 mg IV x1.  Continue spironolactone. Follow renal function. Discontinue lisinopril and begin losartan 100 mg daily.  In 36 hours we will change to [facility] 24/26 twice daily given LV dysfunction.  Continue Toprol. Patient suffered a cardiac arrest in the emergency room but this occurred in the setting of  acute myocardial infarction.  No further arrhythmias and amiodarone discontinued. 2. Hypertension-medication changes as outlined above.  Follow blood pressure. 3. Hyperlipidemia-patient is intolerant to statins.   Continue Zetia.  Check lipids and liver 12 weeks after discharge.  If LDL greater than 70 would consider Repatha. 4. Peripheral vascular disease-patient with history of lower extremity disease and previous carotid endarterectomy.  Continue aspirin and Zetia. 5. History of abdominal aortic aneurysm repair 6. Thyroid density-noted on chest x-ray-follow-up primary care. 7. COPD-Per primary care.  Begin cardiac rehab.   For questions or updates, please contact [place] Please consult [email or web address] for contact info under        Signed, Kirk Ruths, MD  08/31/2020, 8:27 AM

## 2020-08-31 NOTE — Progress Notes (Signed)
NAME:  John Fields, MRN:  [id number], DOB:  1930-01-10, LOS: 2 ADMISSION DATE:  08/29/2020, CONSULTATION DATE:  08/29/2020 REFERRING MD:  Mendel Ryder - [facility], CHIEF COMPLAINT:  Hypoxia following cardiac arrest.    HPI/course in hospital  86 year old vasculopath presented with new onset RSCP without radiation or dyspnea, intermittently since 12/26 without clear relation to exertion.  Persistent chest pain since 0100. In ED, EKG showed anterior STEMI ( TIMI score 6). Suffered brief arrest in ED per cardiology (no documentation). Brought to cath lab for emergent revascularization.  Review of recent medical history: AAA repaired EVAR 2010, chronic diarrhea, possibly statin related. PVD. COPD by history - not on home O2, no pulmonary follow-up, no recent PFTs.  Quit smoking several years ago.   Past Medical History   Past Medical History:  Diagnosis Date  . Abdominal aortic aneurysm (AAA) (HCC)   . Anxiety   . Carotid artery disease (HCC)   . COPD (chronic obstructive pulmonary disease) (HCC)   . Hyperlipidemia   . Hypertension   . Peripheral vascular disease (HCC)   . Prostate cancer [facility])      Past Surgical History:  Procedure Laterality Date  . ABDOMINAL AORTIC ANEURYSM REPAIR    . BACK SURGERY    . CAROTID ENDARTERECTOMY    . Cataract surgery    . CORONARY STENT INTERVENTION N/A 08/29/2020   Procedure: CORONARY STENT INTERVENTION;  Surgeon: Lyn Records, MD;  Location: [facility] INVASIVE CV LAB;  Service: Cardiovascular;  Laterality: N/A;  . CORONARY/GRAFT ACUTE MI REVASCULARIZATION N/A 08/29/2020   Procedure: Coronary/Graft Acute MI Revascularization;  Surgeon: Lyn Records, MD;  Location: [facility] INVASIVE CV LAB;  Service: Cardiovascular;  Laterality: N/A;  . LEFT HEART CATH AND CORONARY ANGIOGRAPHY N/A 08/29/2020   Procedure: LEFT HEART CATH AND CORONARY ANGIOGRAPHY;  Surgeon: Lyn Records, MD;  Location: [facility] INVASIVE CV LAB;  Service: Cardiovascular;  Laterality: N/A;       Interim history/subjective:  No acute distress at rest.  Remains on O2 at 5 L with sats are 99% dyspnea with exertion this could be multifactorial. Objective   Blood pressure (!) 104/54, pulse 77, temperature 98.1 F (36.7 C), temperature source Oral, resp. rate 20, height 5\' 7"  (1.702 m), weight 56.8 kg, SpO2 99 %.        Intake/Output Summary (Last 24 hours) at 08/31/2020 1126 Last data filed at 08/31/2020 0957 Gross per 24 hour  Intake 120 ml  Output --  Net 120 ml   Filed Weights   08/30/20 0500 08/30/20 1300 08/31/20 0545  Weight: 53.4 kg 57 kg 56.8 kg    Examination:  Physical Exam General: Well cachectic male no acute distress at rest HEENT: No JVD or lymphadenopathy is appreciated Neuro: No focal defect CV:  PULM: Decreased air movement faint expiratory wheeze Cardiac; heart sounds are regular GI: soft, bsx4 active  Extremities: warm/dry, 1+ edema  Skin: no rashes or lesions     Ancillary tests (personally reviewed)  CBC: Recent Labs  Lab 08/29/20 1043 08/29/20 1200 08/30/20 0520 08/31/20 0145  WBC 9.2  --  14.9* 9.3  NEUTROABS 6.7  --   --   --   HGB 14.1 13.3 11.3* 9.1*  HCT 45.3 39.0 33.6* 29.2*  MCV 95.8  --  92.8 96.7  PLT 104*  --  116* 103*    Basic Metabolic Panel: Recent Labs  Lab 08/29/20 1043 08/29/20 1200 08/30/20 0520 08/31/20 0145  NA 141 144 144  143  K 3.8 3.0* 3.6 3.5  CL 106 109 110 109  CO2 23  --  23 25  GLUCOSE 113* 179* 134* 105*  BUN 20 22 23  32*  CREATININE 1.10 0.80 1.11 1.24  CALCIUM 8.9  --  8.6* 8.8*   GFR: Estimated Creatinine Clearance: 33.1 mL/min (by C-G formula based on SCr of 1.24 mg/dL). Recent Labs  Lab 08/29/20 1043 08/30/20 0520 08/31/20 0145  WBC 9.2 14.9* 9.3    Liver Function Tests: Recent Labs  Lab 08/29/20 1043  AST 91*  ALT 23  ALKPHOS 56  BILITOT 0.8  PROT 6.2*  ALBUMIN 3.3*   No results for input(s): LIPASE, AMYLASE in the last 168 hours. No results for input(s): AMMONIA in  the last 168 hours.  ABG    Component Value Date/Time   TCO2 22 08/29/2020 1200     Coagulation Profile: Recent Labs  Lab 08/29/20 1043  INR 1.1    Cardiac Enzymes: No results for input(s): CKTOTAL, CKMB, CKMBINDEX, TROPONINI in the last 168 hours.  HbA1C: Hgb A1c MFr Bld  Date/Time Value Ref Range Status  08/29/2020 10:43 AM 5.3 4.8 - 5.6 % Final    Comment:    (NOTE) Pre diabetes:          5.7%-6.4%  Diabetes:              >6.4%  Glycemic control for   <7.0% adults with diabetes     CBG: Recent Labs  Lab 08/30/20 0720 08/30/20 1055 08/30/20 1624 08/30/20 2042 08/31/20 0815  GLUCAP 118* 119* 120* 101* 105*    Chest x-ray 08/30/2020 with some interstitial edema and changes of COPD  Assessment & Plan:   Critically ill due to acute ST elevation anterior wall myocardial infarction Killip class I Acute hypoxic respiratory insufficiency Status post VF cardiac arrest likely ischemic in origin. Calcified coronary artery disease Peripheral vascular disease Status post EVAR repair of AAA COPD by history Possible microscopic colitis. Dyspnea with exertion Plan: Wean FiO2 as tolerated currently on 5 L nasal cannula with sats of 99% Try 1229 shows interstitial pulmonary edema small right pleural effusion and changes of COPD.  Continue with pulmonary toilet Continue with bronchodilators BNP is 1345 diuresis per cardiology Pulmonary toilet Increase activity as tolerated Pulmonary critical care available as needed      Daily Goals Checklist  Pain/Anxiety/Delirium protocol (if indicated): Tylenol PM VAP protocol (if indicated): Not intubated Respiratory support goals: Wean oxygen as tolerated.  Incentive spirometry. Blood pressure target: Systolic blood pressure less than 140 DVT prophylaxis: On systemic heparin  Nutritional status and feeding goals: Progressive cardiac diet GI prophylaxis: Not indicated Fluid status goals: IV hydration 6 hours  post Urinary catheter: Not required Central lines: PIV's only Glucose control: No history of diabetes, monitor only. Mobility/therapy needs: Phase 1 cardiac Antibiotic de-escalation: No antibiotic Home medication reconciliation: Home medications reconciled Daily labs: BMP daily Code Status: Full code Family Communication: Cardiology to update family Disposition: Currently on stepdown unit   Richardson Landry Lubertha Leite ACNP Acute Care Nurse Practitioner [place] Please consult [place] 08/31/2020, 11:26 AM

## 2020-08-31 NOTE — Plan of Care (Signed)

## 2020-09-01 DIAGNOSIS — I469 Cardiac arrest, cause unspecified: Secondary | ICD-10-CM | POA: Diagnosis not present

## 2020-09-01 DIAGNOSIS — J9601 Acute respiratory failure with hypoxia: Secondary | ICD-10-CM | POA: Diagnosis not present

## 2020-09-01 DIAGNOSIS — I2102 ST elevation (STEMI) myocardial infarction involving left anterior descending coronary artery: Secondary | ICD-10-CM | POA: Diagnosis not present

## 2020-09-01 LAB — BASIC METABOLIC PANEL
Anion gap: 10 (ref 5–15)
BUN: 45 mg/dL — ABNORMAL HIGH (ref 8–23)
CO2: 26 mmol/L (ref 22–32)
Calcium: 8.8 mg/dL — ABNORMAL LOW (ref 8.9–10.3)
Chloride: 106 mmol/L (ref 98–111)
Creatinine, Ser: 1.22 mg/dL (ref 0.61–1.24)
GFR, Estimated: 57 mL/min — ABNORMAL LOW (ref >60)
Glucose, Bld: 127 mg/dL — ABNORMAL HIGH (ref 70–99)
Potassium: 3.6 mmol/L (ref 3.5–5.1)
Sodium: 142 mmol/L (ref 135–145)

## 2020-09-01 LAB — CBC
HCT: 27.6 % — ABNORMAL LOW (ref 39.0–52.0)
Hemoglobin: 8.7 g/dL — ABNORMAL LOW (ref 13.0–17.0)
MCH: 30.2 pg (ref 26.0–34.0)
MCHC: 31.5 g/dL (ref 30.0–36.0)
MCV: 95.8 fL (ref 80.0–100.0)
Platelets: 109 10*3/uL — ABNORMAL LOW (ref 150–400)
RBC: 2.88 MIL/uL — ABNORMAL LOW (ref 4.22–5.81)
RDW: 14.6 % (ref 11.5–15.5)
WBC: 6.6 10*3/uL (ref 4.0–10.5)
nRBC: 0 % (ref 0.0–0.2)

## 2020-09-01 LAB — GLUCOSE, CAPILLARY
Glucose-Capillary: 124 mg/dL — ABNORMAL HIGH (ref 70–99)
Glucose-Capillary: 151 mg/dL — ABNORMAL HIGH (ref 70–99)
Glucose-Capillary: 175 mg/dL — ABNORMAL HIGH (ref 70–99)
Glucose-Capillary: 135 mg/dL — ABNORMAL HIGH (ref 70–99)

## 2020-09-01 MED ORDER — AEROCHAMBER PLUS FLO-VU MEDIUM MISC
1.0000 | Freq: Once | Status: AC
  Administered 2020-09-01: 1
  Filled 2020-09-01: qty 1

## 2020-09-01 MED ORDER — UMECLIDINIUM BROMIDE 62.5 MCG/INH IN AEPB
1.0000 | INHALATION_SPRAY | Freq: Every day | RESPIRATORY_TRACT | Status: DC
  Administered 2020-09-01 – 2020-09-12 (×10): 1 via RESPIRATORY_TRACT
  Filled 2020-09-01 (×2): qty 7

## 2020-09-01 MED ORDER — PREDNISONE 20 MG PO TABS
40.0000 mg | ORAL_TABLET | Freq: Every day | ORAL | Status: DC
  Administered 2020-09-02 – 2020-09-04 (×3): 40 mg via ORAL
  Filled 2020-09-01: qty 4
  Filled 2020-09-01 (×2): qty 2

## 2020-09-01 MED ORDER — ALBUTEROL SULFATE HFA 108 (90 BASE) MCG/ACT IN AERS
1.0000 | INHALATION_SPRAY | RESPIRATORY_TRACT | Status: DC | PRN
  Administered 2020-09-03: 12:00:00 2 via RESPIRATORY_TRACT
  Filled 2020-09-01: qty 6.7

## 2020-09-01 MED ORDER — METOPROLOL SUCCINATE ER 25 MG PO TB24
25.0000 mg | ORAL_TABLET | Freq: Every day | ORAL | Status: DC
  Administered 2020-09-01 – 2020-09-02 (×2): 25 mg via ORAL
  Filled 2020-09-01 (×2): qty 1

## 2020-09-01 MED ORDER — GUAIFENESIN-DM 100-10 MG/5ML PO SYRP
5.0000 mL | ORAL_SOLUTION | ORAL | Status: DC | PRN
  Administered 2020-09-01: 5 mL via ORAL
  Filled 2020-09-01: qty 5

## 2020-09-01 NOTE — TOC Benefit Eligibility Note (Signed)
Transition of Care [facility]) Benefit Eligibility Note    Patient Details  Name: John Fields MRN: [id number] Date of Birth: November 16, 1929   Medication/Dose: BRILINTA  90 MG BID  Covered?: Yes  Tier: 3 Drug  Prescription Coverage Preferred Pharmacy: [facility]  Spoke with Person/Company/Phone Number:: VAL   @ [employer] #  [phone]  Co-Pay: $142.00     Deductible: Unmet (OUT-OF-POCKET::UNMET)  Additional Notes: ALTERNATIVE ; CLOPIDOGREL 75 MG DAILY COVER- YES CO-PAY- $3.53 TIER- 1 DRUG P/A-NO    Mardene Sayer Phone Number: 09/01/2020, 12:53 PM

## 2020-09-01 NOTE — Care Management Important Message (Signed)
Important Message  Patient Details  Name: John Fields MRN: [id number] Date of Birth: 15-Feb-1930   Medicare Important Message Given:  Yes     Ellicia Alix 09/01/2020, 3:02 PM

## 2020-09-01 NOTE — Progress Notes (Signed)
Progress Note  Patient Name: John Fields Date of Encounter: 09/01/2020  [facility] Cardiologist: New  Subjective   Pt denies dyspnea or CP  Inpatient Medications    Scheduled Meds: . aspirin EC  81 mg Oral Daily  . busPIRone  15 mg Oral Daily  . Chlorhexidine Gluconate Cloth  6 each Topical Daily  . ezetimibe  10 mg Oral Daily  . feeding supplement  237 mL Oral BID BM  . heparin injection (subcutaneous)  5,000 Units Subcutaneous Q8H  . ipratropium-albuterol  3 mL Nebulization Q6H  . losartan  50 mg Oral Daily  . methylPREDNISolone (SOLU-MEDROL) injection  40 mg Intravenous Daily  . metoprolol succinate  50 mg Oral Daily  . sodium chloride flush  3 mL Intravenous Q12H  . spironolactone  12.5 mg Oral Daily  . tamsulosin  0.4 mg Oral Daily  . ticagrelor  90 mg Oral BID   Continuous Infusions: . sodium chloride Stopped (08/30/20 0817)  . sodium chloride     PRN Meds: sodium chloride, acetaminophen, albuterol, LORazepam, nitroGLYCERIN, ondansetron (ZOFRAN) IV, oxyCODONE, sodium chloride flush   Vital Signs    Vitals:   09/01/20 0006 09/01/20 0158 09/01/20 0420 09/01/20 0626  BP: (!) 94/49  94/63   Pulse: 81  76   Resp:   20   Temp:   (!) 97.5 F (36.4 C)   TempSrc:   Oral   SpO2: 93% 98% 97%   Weight:    55.6 kg  Height:        Intake/Output Summary (Last 24 hours) at 09/01/2020 [id number] Last data filed at 08/31/2020 2231 Gross per 24 hour  Intake 273 ml  Output 600 ml  Net -327 ml   Last 3 Weights 09/01/2020 08/31/2020 08/30/2020  Weight (lbs) 122 lb 9.2 oz 125 lb 3.5 oz 125 lb 11.2 oz  Weight (kg) 55.6 kg 56.8 kg 57.017 kg      Telemetry    Sinus- Personally Reviewed   Physical Exam   GEN: NAD WD frail Neck: supple Cardiac: RRR Respiratory: Diminished BS; no rhonchi GI: Soft, NT/ND, no masses MS: No edema Neuro:  No focal findings Psych: Normal affect   Labs    High Sensitivity Troponin:   Recent Labs  Lab 08/29/20 1043  08/29/20 1415 08/29/20 1634 08/29/20 1835  TROPONINIHS 12,408* >27,000* >27,000* >27,000*      Chemistry Recent Labs  Lab 08/29/20 1043 08/29/20 1200 08/30/20 0520 08/31/20 0145  NA 141 144 144 143  K 3.8 3.0* 3.6 3.5  CL 106 109 110 109  CO2 23  --  23 25  GLUCOSE 113* 179* 134* 105*  BUN 20 22 23  32*  CREATININE 1.10 0.80 1.11 1.24  CALCIUM 8.9  --  8.6* 8.8*  PROT 6.2*  --   --   --   ALBUMIN 3.3*  --   --   --   AST 91*  --   --   --   ALT 23  --   --   --   ALKPHOS 56  --   --   --   BILITOT 0.8  --   --   --   GFRNONAA >60  --  >60 56*  ANIONGAP 12  --  11 9     Hematology Recent Labs  Lab 08/30/20 0520 08/31/20 0145 09/01/20 0128  WBC 14.9* 9.3 6.6  RBC 3.62* 3.02* 2.88*  HGB 11.3* 9.1* 8.7*  HCT 33.6* 29.2* 27.6*  MCV 92.8  96.7 95.8  MCH 31.2 30.1 30.2  MCHC 33.6 31.2 31.5  RDW 14.3 14.6 14.6  PLT 116* 103* 109*    Radiology    DG CHEST PORT 1 VIEW  Result Date: 08/30/2020 CLINICAL DATA:  Shortness of breath. EXAM: PORTABLE CHEST 1 VIEW COMPARISON:  Chest x-ray from yesterday. FINDINGS: Normal heart size. Unchanged right upper tracheal deviation due to known underlying thyroid goiter. Progressive mild diffuse interstitial thickening, most prominent at the right greater than left lung bases. Probable small right pleural effusion. No pneumothorax. Emphysema. No acute osseous abnormality. IMPRESSION: 1. New mild interstitial pulmonary edema and probable small right pleural effusion. 2. COPD. Electronically Signed   By: Obie Dredge M.D.   On: 08/30/2020 10:13    Patient Profile     86 y.o. male admitted with acute anterior myocardial infarction (late presentation). Patient suffered ventricular fibrillation arrest in the emergency room requiring transient CPR and defibrillation. Cardiac catheterization revealed occluded LAD and 70% right coronary artery. Left ventricular end-diastolic pressure 9 mmHg. Patient had PCI of the LAD. Echocardiogram showed  ejection fraction 35 to 40%, grade 1 diastolic dysfunction, mild mitral regurgitation.  Assessment & Plan    1. Acute anterior ST elevation myocardial infarction-patient is status post PCI of LAD.  Plan to continue aspirin and Brilinta.  Intolerant to statins.  Patient's blood pressure decreased this AM.  We will hold losartan and spironolactone and decrease Toprol to 25 mg daily.  Will not diurese further as he appears to be euvolemic.  Will resume ARB or add Entresto later as blood pressure allows.  Await BUN and creatinine this morning. Patient suffered a cardiac arrest in the emergency room on the day of admission but this occurred in the setting of acute myocardial infarction.  No further arrhythmias and amiodarone discontinued.  Need to continue cardiac rehab. 2. Hypertension-blood pressure decreased this morning.  Medication changes as outlined above. 3. Hyperlipidemia-patient is intolerant to statins.   Continue Zetia.  Check lipids and liver 12 weeks after discharge.  If LDL greater than 70 would consider Repatha. 4. Peripheral vascular disease-patient with history of lower extremity disease and previous carotid endarterectomy.  Continue aspirin and Zetia. 5. History of abdominal aortic aneurysm repair 6. Thyroid density-noted on chest x-ray-follow-up primary care. 7. COPD-dyspnea improved this morning.  Continue pulmonary toilet per pulmonary.  Begin cardiac rehab.   For questions or updates, please contact [facility] Please consult [email or web address] for contact info under        Signed, Olga Millers, MD  09/01/2020, 7:22 AM

## 2020-09-01 NOTE — Progress Notes (Addendum)
NAME:  John Fields, MRN:  [id number], DOB:  Aug 30, 1930, LOS: 3 ADMISSION DATE:  08/29/2020, CONSULTATION DATE:  08/29/2020 REFERRING MD:  Mendel Ryder - [facility], CHIEF COMPLAINT:  Hypoxia following cardiac arrest.    HPI/course in hospital  86 year old vasculopath presented with new onset RSCP without radiation or dyspnea, intermittently since 12/26 without clear relation to exertion.  Persistent chest pain since 0100. In ED, EKG showed anterior STEMI ( TIMI score 6). Suffered brief arrest in ED per cardiology (no documentation). Brought to cath lab for emergent revascularization.  Review of recent medical history: AAA repaired EVAR 2010, chronic diarrhea, possibly statin related. PVD. COPD by history - not on home O2, no pulmonary follow-up, no recent PFTs.  Quit smoking several years ago.   Past Medical History   Past Medical History:  Diagnosis Date  . Abdominal aortic aneurysm (AAA) (HCC)   . Anxiety   . Carotid artery disease (HCC)   . COPD (chronic obstructive pulmonary disease) (HCC)   . Hyperlipidemia   . Hypertension   . Peripheral vascular disease (HCC)   . Prostate cancer [facility])      Past Surgical History:  Procedure Laterality Date  . ABDOMINAL AORTIC ANEURYSM REPAIR    . BACK SURGERY    . CAROTID ENDARTERECTOMY    . Cataract surgery    . CORONARY STENT INTERVENTION N/A 08/29/2020   Procedure: CORONARY STENT INTERVENTION;  Surgeon: Lyn Records, MD;  Location: [facility] INVASIVE CV LAB;  Service: Cardiovascular;  Laterality: N/A;  . CORONARY/GRAFT ACUTE MI REVASCULARIZATION N/A 08/29/2020   Procedure: Coronary/Graft Acute MI Revascularization;  Surgeon: Lyn Records, MD;  Location: [facility] INVASIVE CV LAB;  Service: Cardiovascular;  Laterality: N/A;  . LEFT HEART CATH AND CORONARY ANGIOGRAPHY N/A 08/29/2020   Procedure: LEFT HEART CATH AND CORONARY ANGIOGRAPHY;  Surgeon: Lyn Records, MD;  Location: [facility] INVASIVE CV LAB;  Service: Cardiovascular;  Laterality: N/A;       Interim history/subjective:  Much improved with the last 24 hours most likely combination of steroids bronchodilators and diuresis Objective   Blood pressure 94/63, pulse 74, temperature (!) 97.5 F (36.4 C), temperature source Oral, resp. rate 18, height 5\' 7"  (1.702 m), weight 55.6 kg, SpO2 94 %.    FiO2 (%):  [32 %] 32 %   Intake/Output Summary (Last 24 hours) at 09/01/2020 0803 Last data filed at 08/31/2020 2231 Gross per 24 hour  Intake 173 ml  Output 600 ml  Net -427 ml   Filed Weights   08/30/20 1300 08/31/20 0545 09/01/20 0626  Weight: 57 kg 56.8 kg 55.6 kg    Examination:  Physical Exam Awake and alert much more interactive, obvious improvement in pulmonary status from 08/31/2020 No JVD or lymphadenopathy is appreciated Chest no wheezes noted improved air movement nasal cannula Heart sounds regular rate and rhythm Abdomen soft nontender positive bowel Lower extremities without edema Overall much improved from from 08/31/2020 with initiation of Solu-Medrol bronchodilators and diuresis.     Ancillary tests (personally reviewed)  CBC: Recent Labs  Lab 08/29/20 1043 08/29/20 1200 08/30/20 0520 08/31/20 0145 09/01/20 0128  WBC 9.2  --  14.9* 9.3 6.6  NEUTROABS 6.7  --   --   --   --   HGB 14.1 13.3 11.3* 9.1* 8.7*  HCT 45.3 39.0 33.6* 29.2* 27.6*  MCV 95.8  --  92.8 96.7 95.8  PLT 104*  --  116* 103* 109*    Basic Metabolic Panel: Recent Labs  Lab 08/29/20 1043 08/29/20 1200 08/30/20 0520 08/31/20 0145  NA 141 144 144 143  K 3.8 3.0* 3.6 3.5  CL 106 109 110 109  CO2 23  --  23 25  GLUCOSE 113* 179* 134* 105*  BUN 20 22 23  32*  CREATININE 1.10 0.80 1.11 1.24  CALCIUM 8.9  --  8.6* 8.8*   GFR: Estimated Creatinine Clearance: 32.4 mL/min (by C-G formula based on SCr of 1.24 mg/dL). Recent Labs  Lab 08/29/20 1043 08/30/20 0520 08/31/20 0145 09/01/20 0128  WBC 9.2 14.9* 9.3 6.6    Liver Function Tests: Recent Labs  Lab 08/29/20 1043   AST 91*  ALT 23  ALKPHOS 56  BILITOT 0.8  PROT 6.2*  ALBUMIN 3.3*   No results for input(s): LIPASE, AMYLASE in the last 168 hours. No results for input(s): AMMONIA in the last 168 hours.  ABG    Component Value Date/Time   TCO2 22 08/29/2020 1200     Coagulation Profile: Recent Labs  Lab 08/29/20 1043  INR 1.1    Cardiac Enzymes: No results for input(s): CKTOTAL, CKMB, CKMBINDEX, TROPONINI in the last 168 hours.  HbA1C: Hgb A1c MFr Bld  Date/Time Value Ref Range Status  08/29/2020 10:43 AM 5.3 4.8 - 5.6 % Final    Comment:    (NOTE) Pre diabetes:          5.7%-6.4%  Diabetes:              >6.4%  Glycemic control for   <7.0% adults with diabetes     CBG: Recent Labs  Lab 08/30/20 2042 08/31/20 0815 08/31/20 1110 08/31/20 1621 08/31/20 2131  GLUCAP 101* 105* 135* 110* 151*    Chest x-ray 08/30/2020 with some interstitial edema and changes of COPD  Assessment & Plan:   Critically ill due to acute ST elevation anterior wall myocardial infarction Killip class I Acute hypoxic respiratory insufficiency Status post VF cardiac arrest likely ischemic in origin. Calcified coronary artery disease Peripheral vascular disease Status post EVAR repair of AAA COPD by history Possible microscopic colitis. Dyspnea with exertion Acute exacerbation of COPD Plan:  Solu-Medrol bronchodilators initiated 08/31/2020 with much improvement in pulmonary status as of 09/01/2020.  Continue Solu-Medrol for 5 doses as ordered. Wean FiO2 as tolerated currently at 2 L with sats 94% he does desaturate with activity though. Diuresis as tolerated No new chest x-ray at this time. Pulmonary critical care will be available as needed. Consider pulmonary follow-up as an outpatient.   Daily Goals Checklist  Pain/Anxiety/Delirium protocol (if indicated): Tylenol PM VAP protocol (if indicated): Not intubated Respiratory support goals: Wean oxygen as tolerated.  Incentive  spirometry. Blood pressure target: Systolic blood pressure less than 140 DVT prophylaxis: On systemic heparin  Nutritional status and feeding goals: Progressive cardiac diet GI prophylaxis: Not indicated Fluid status goals: IV hydration 6 hours post Urinary catheter: Not required Central lines: PIV's only Glucose control: No history of diabetes, monitor only. Mobility/therapy needs: Phase 1 cardiac Antibiotic de-escalation: No antibiotic Home medication reconciliation: Home medications reconciled Daily labs: BMP daily Code Status: Full code Family Communication: Cardiology to update family Disposition: Currently on stepdown unit, much improved on 09/01/2020.   Richardson Landry Derrica Sieg ACNP Acute Care Nurse Practitioner [place] Please consult [place] 09/01/2020, 8:03 AM

## 2020-09-01 NOTE — Evaluation (Signed)
Physical Therapy Evaluation Patient Details Name: John Fields MRN: [id number] DOB: 1930/06/17 Today's Date: 09/01/2020   History of Present Illness  86 yo admitted 12/28 with chest pain and STEMI with brief arrest in ED s/p cath lab for revascularization. PMHx: AAA repair, chronic diarrhea, PVD, COPD, HTN, HLD, Prostate CA  Clinical Impression  Pt pleasant and very willing to mobilize. Pt able to demonstrate SpO2 92% on RA at rest with quick desaturation with activity and with limited gait desats to 88% on 6L. Pt lives alone and has assist of his daughter but not 24hr assist and with present limited activity and poor cardiopulmonary tolerance return home is not recommended. Pt with decreased activity, gait, and cardiopulmonary function who will benefit from acute therapy to maximize mobility, safety and independence.   HR 90-103 92% on RA at rest, drop to 88% on 6L with limited gait Pre BP 151/65 Post gait 125/112    Follow Up Recommendations SNF;Supervision/Assistance - 24 hour    Equipment Recommendations  Rolling walker with 5" wheels;3in1 (PT)    Recommendations for Other Services OT consult     Precautions / Restrictions Precautions Precautions: Fall Precaution Comments: incontinent of urine      Mobility  Bed Mobility Overal bed mobility: Needs Assistance Bed Mobility: Supine to Sit     Supine to sit: Supervision;HOB elevated     General bed mobility comments: HOB 30 degrees with supervision for lines and safety    Transfers Overall transfer level: Needs assistance   Transfers: Sit to/from Stand Sit to Stand: Min guard         General transfer comment: guarding for safety. Initial stand from bed pt reaching out for environmental support with desaturation to 80% on RA. Pt returned to 3L and sat for grossly 2 min for recovery. Pt then stood again with RW with guarding. Decreased control of descent to surface after gait due to  fatigue  Ambulation/Gait Ambulation/Gait assistance: Min assist Gait Distance (Feet): 22 Feet Assistive device: Rolling walker (2 wheeled) Gait Pattern/deviations: Step-through pattern;Decreased stride length;Trunk flexed   Gait velocity interpretation: 1.31 - 2.62 ft/sec, indicative of limited community ambulator General Gait Details: cues for posture and breathing technique. Pt on 4L for gait with pt walking well for 11' then abrupt fatigue with HR 103 and SpO2 84% on 4L with O2 6L and 88% increased assist and cues to return to chair with grossly 2 min recovery once seated  Stairs            Wheelchair Mobility    Modified Rankin (Stroke Patients Only)       Balance Overall balance assessment: Needs assistance   Sitting balance-Leahy Scale: Fair Sitting balance - Comments: able to sit EOb without UE support   Standing balance support: Bilateral upper extremity supported Standing balance-Leahy Scale: Poor Standing balance comment: single and bil UE support in standing                             Pertinent Vitals/Pain Pain Assessment: No/denies pain    Home Living Family/patient expects to be discharged to:: Private residence Living Arrangements: Alone Available Help at Discharge: Family;Available PRN/intermittently Type of Home: House Home Access: Stairs to enter Entrance Stairs-Rails: Doctor, general practice of Steps: 5 Home Layout: One level Home Equipment: Walker - 2 wheels;Shower seat      Prior Function Level of Independence: Needs assistance   Gait / Transfers Assistance Needed: uses RW occasionally  ADL's / Homemaking Assistance Needed: eats out a lot, daughter does the Engineer, petroleum Dominance        Extremity/Trunk Assessment   Upper Extremity Assessment Upper Extremity Assessment: Generalized weakness    Lower Extremity Assessment Lower Extremity Assessment: Generalized weakness    Cervical /  Trunk Assessment Cervical / Trunk Assessment: Kyphotic  Communication   Communication: HOH  Cognition Arousal/Alertness: Awake/alert Behavior During Therapy: WFL for tasks assessed/performed Overall Cognitive Status: Impaired/Different from baseline Area of Impairment: Safety/judgement                         Safety/Judgement: Decreased awareness of safety;Decreased awareness of deficits            General Comments      Exercises General Exercises - Lower Extremity Long Arc Quad: AROM;Both;Seated;5 reps Hip Flexion/Marching: AROM;Both;Seated;5 reps   Assessment/Plan    PT Assessment Patient needs continued PT services  PT Problem List Decreased strength;Decreased mobility;Decreased safety awareness;Decreased activity tolerance;Decreased balance;Decreased knowledge of use of DME;Cardiopulmonary status limiting activity;Decreased cognition       PT Treatment Interventions DME instruction;Therapeutic exercise;Gait training;Balance training;Functional mobility training;Therapeutic activities;Patient/family education    PT Goals (Current goals can be found in the Care Plan section)  Acute Rehab PT Goals Patient Stated Goal: return to walking and home PT Goal Formulation: With patient Time For Goal Achievement: 09/15/20 Potential to Achieve Goals: Fair    Frequency Min 3X/week   Barriers to discharge Decreased caregiver support      Co-evaluation               AM-PAC PT "6 Clicks" Mobility  Outcome Measure Help needed turning from your back to your side while in a flat bed without using bedrails?: A Little Help needed moving from lying on your back to sitting on the side of a flat bed without using bedrails?: A Little Help needed moving to and from a bed to a chair (including a wheelchair)?: A Little Help needed standing up from a chair using your arms (e.g., wheelchair or bedside chair)?: A Little Help needed to walk in hospital room?: A Little Help  needed climbing 3-5 steps with a railing? : A Lot 6 Click Score: 17    End of Session Equipment Utilized During Treatment: Gait belt;Oxygen Activity Tolerance: Patient limited by fatigue Patient left: in chair;with call bell/phone within reach;with chair alarm set Nurse Communication: Mobility status PT Visit Diagnosis: Other abnormalities of gait and mobility (R26.89);Difficulty in walking, not elsewhere classified (R26.2);Muscle weakness (generalized) (M62.81)    Time: [id number] PT Time Calculation (min) (ACUTE ONLY): 24 min   Charges:   PT Evaluation $PT Eval Moderate Complexity: 1 Mod PT Treatments $Therapeutic Activity: 8-22 mins        Kasi Lasky P, PT Acute Rehabilitation Services Pager: [phone] Office: [phone]   Rexine Gowens B Geo Slone 09/01/2020, 1:03 PM

## 2020-09-01 NOTE — Plan of Care (Signed)

## 2020-09-01 NOTE — Care Management (Signed)
1049 09-01-20 Benefits check submitted for [facility]. Case Manager will follow for cost. Graves-Bigelow, Lamar Laundry, RN,BSN Case Manager

## 2020-09-02 DIAGNOSIS — I2102 ST elevation (STEMI) myocardial infarction involving left anterior descending coronary artery: Secondary | ICD-10-CM | POA: Diagnosis not present

## 2020-09-02 LAB — BASIC METABOLIC PANEL
Anion gap: 10 (ref 5–15)
BUN: 59 mg/dL — ABNORMAL HIGH (ref 8–23)
CO2: 26 mmol/L (ref 22–32)
Calcium: 8.8 mg/dL — ABNORMAL LOW (ref 8.9–10.3)
Chloride: 104 mmol/L (ref 98–111)
Creatinine, Ser: 1.23 mg/dL (ref 0.61–1.24)
GFR, Estimated: 56 mL/min — ABNORMAL LOW (ref >60)
Glucose, Bld: 118 mg/dL — ABNORMAL HIGH (ref 70–99)
Potassium: 4.1 mmol/L (ref 3.5–5.1)
Sodium: 140 mmol/L (ref 135–145)

## 2020-09-02 LAB — GLUCOSE, CAPILLARY
Glucose-Capillary: 95 mg/dL (ref 70–99)
Glucose-Capillary: 87 mg/dL (ref 70–99)
Glucose-Capillary: 133 mg/dL — ABNORMAL HIGH (ref 70–99)
Glucose-Capillary: 103 mg/dL — ABNORMAL HIGH (ref 70–99)

## 2020-09-02 LAB — CBC
HCT: 26.5 % — ABNORMAL LOW (ref 39.0–52.0)
Hemoglobin: 8.3 g/dL — ABNORMAL LOW (ref 13.0–17.0)
MCH: 29.7 pg (ref 26.0–34.0)
MCHC: 31.3 g/dL (ref 30.0–36.0)
MCV: 95 fL (ref 80.0–100.0)
Platelets: 126 10*3/uL — ABNORMAL LOW (ref 150–400)
RBC: 2.79 MIL/uL — ABNORMAL LOW (ref 4.22–5.81)
RDW: 14.6 % (ref 11.5–15.5)
WBC: 10 10*3/uL (ref 4.0–10.5)
nRBC: 0 % (ref 0.0–0.2)

## 2020-09-02 MED ORDER — LOPERAMIDE HCL 2 MG PO CAPS
2.0000 mg | ORAL_CAPSULE | ORAL | Status: DC | PRN
  Administered 2020-09-02 – 2020-09-03 (×2): 2 mg via ORAL
  Filled 2020-09-02 (×2): qty 1

## 2020-09-02 MED ORDER — LOSARTAN POTASSIUM 25 MG PO TABS
25.0000 mg | ORAL_TABLET | Freq: Every day | ORAL | Status: DC
  Administered 2020-09-02: 25 mg via ORAL
  Filled 2020-09-02: qty 1

## 2020-09-02 NOTE — Evaluation (Signed)
Occupational Therapy Evaluation Patient Details Name: John Fields MRN: [id number] DOB: [birth year] Today's Date: 09/02/2020    History of Present Illness This 87 y.o. male admitted with late presentation anterior MI.  He suffered V-Fib arrest in the ED requiring transient CPR and defibrillation.  cardiac cath revealed occluded LAD and 70% Rt coronary artery underwent revascularizaion.   PMH includes:  PVD, s/p AAA repair, COPD   Clinical Impression   Pt admitted with above. He demonstrates the below listed deficits and will benefit from continued OT to maximize safety and independence with BADLs.  Pt presents to OT with generalized weakness, impaired balance, decreased activity tolerance,, decreased safety awareness.  Pt currently requires set up assist to mod A for ADLs and up to min A for functional transfers.  DOE 3/4 with activity and 03 sats 88-91% during activity with pt on 3L supplemental 02.  He reports he lives alone with intermittent assist from his daughter for IADLs.  Anticipate he will require SNF level rehab at discharge.       Follow Up Recommendations  SNF    Equipment Recommendations  None recommended by OT    Recommendations for Other Services       Precautions / Restrictions Precautions Precautions: Fall Precaution Comments: incontinent of urine      Mobility Bed Mobility Overal bed mobility: Needs Assistance Bed Mobility: Supine to Sit     Supine to sit: Supervision;HOB elevated          Transfers Overall transfer level: Needs assistance Equipment used: Rolling walker (2 wheeled) Transfers: Sit to/from [employer] Sit to Stand: Min guard Stand pivot transfers: Min assist       General transfer comment: min A for safety and walker management    Balance Overall balance assessment: Needs assistance   Sitting balance-Leahy Scale: Fair     Standing balance support: Single extremity supported;During functional activity Standing  balance-Leahy Scale: Poor Standing balance comment: reliant on UE support                           ADL either performed or assessed with clinical judgement   ADL Overall ADL's : Needs assistance/impaired Eating/Feeding: Independent   Grooming: Wash/dry hands;Wash/dry face;Oral care;Brushing hair;Set up;Sitting   Upper Body Bathing: Set up;Supervision/ safety;Sitting   Lower Body Bathing: Sit to/from stand;Moderate assistance   Upper Body Dressing : Minimal assistance;Cueing for UE precautions   Lower Body Dressing: Sit to/from stand;Moderate assistance Lower Body Dressing Details (indicate cue type and reason): able do don/doff socks with rest break due to fatigue Toilet Transfer: Minimal assistance;Stand-pivot;BSC;RW Toilet Transfer Details (indicate cue type and reason): assist to steady and for walker management Toileting- Clothing Manipulation and Hygiene: Maximal assistance;Sit to/from stand       Functional mobility during ADLs: Minimal assistance;Rolling walker       Vision Patient Visual Report: No change from baseline       Perception     Praxis      Pertinent Vitals/Pain Pain Assessment: No/denies pain     Hand Dominance     Extremity/Trunk Assessment Upper Extremity Assessment Upper Extremity Assessment: Generalized weakness   Lower Extremity Assessment Lower Extremity Assessment: Generalized weakness   Cervical / Trunk Assessment Cervical / Trunk Assessment: Kyphotic   Communication Communication Communication: HOH   Cognition Arousal/Alertness: Awake/alert Behavior During Therapy: WFL for tasks assessed/performed Overall Cognitive Status: No family/caregiver present to determine baseline cognitive functioning  General Comments: Pt is a bit slow to process info, and demonstrates decreased safety awareness   General Comments  02 sats decreased 88-90% on 3L supplemental 02.  Encouraged IS  use    Exercises     Shoulder Instructions      Home Living Family/patient expects to be discharged to:: Private residence Living Arrangements: Alone Available Help at Discharge: Family;Available PRN/intermittently Type of Home: House Home Access: Stairs to enter [employer] of Steps: 5 Entrance Stairs-Rails: Right;Left Home Layout: One level     Bathroom Shower/Tub: Teacher, early years/pre: Standard     Home Equipment: Environmental consultant - 2 wheels;Shower seat          Prior Functioning/Environment Level of Independence: Needs assistance  Gait / Transfers Assistance Needed: uses RW occasionally ADL's / Homemaking Assistance Needed: Pt reports he drives, eats out mostly, and has intermittent assist with IADLs, but is mostly mod I            OT Problem List: Decreased strength;Decreased activity tolerance;Impaired balance (sitting and/or standing);Decreased cognition;Decreased safety awareness;Decreased knowledge of use of DME or AE;Cardiopulmonary status limiting activity      OT Treatment/Interventions: Self-care/ADL training;DME and/or AE instruction;Energy conservation;Therapeutic activities;Patient/family education;Balance training    OT Goals(Current goals can be found in the care plan section) Acute Rehab OT Goals Patient Stated Goal: to breathe better OT Goal Formulation: With patient Time For Goal Achievement: 09/16/20 Potential to Achieve Goals: Good ADL Goals Pt Will Perform Grooming: with min guard assist;standing Pt Will Perform Upper Body Bathing: with set-up;with supervision;sitting Pt Will Perform Lower Body Bathing: with min guard assist;sit to/from stand Pt Will Perform Upper Body Dressing: with set-up;with supervision;sitting Pt Will Perform Lower Body Dressing: with min guard assist;sit to/from stand Pt Will Transfer to Toilet: with min guard assist;ambulating;regular height toilet;bedside commode;grab bars Pt Will Perform Toileting -  Clothing Manipulation and hygiene: with min guard assist;sit to/from stand Additional ADL Goal #1: Pt will incorporate energy conservation techniques into ADL tasks with min cues  OT Frequency: Min 2X/week   Barriers to D/C: Decreased caregiver support          Co-evaluation              AM-PAC OT "6 Clicks" Daily Activity     Outcome Measure Help from another person eating meals?: None Help from another person taking care of personal grooming?: A Little Help from another person toileting, which includes using toliet, bedpan, or urinal?: A Lot Help from another person bathing (including washing, rinsing, drying)?: A Lot Help from another person to put on and taking off regular upper body clothing?: A Little Help from another person to put on and taking off regular lower body clothing?: A Lot 6 Click Score: 16   End of Session Equipment Utilized During Treatment: Rolling walker;Oxygen;Gait belt Nurse Communication: Mobility status  Activity Tolerance: Patient limited by fatigue Patient left: in chair;with call bell/phone within reach;with chair alarm set  OT Visit Diagnosis: Unsteadiness on feet (R26.81)                Time: [id number] OT Time Calculation (min): 24 min Charges:  OT General Charges $OT Visit: 1 Visit OT Evaluation $OT Eval Moderate Complexity: 1 Mod OT Treatments $Self Care/Home Management : 8-22 mins  Nilsa Nutting., OTR/L Acute Rehabilitation Services Pager [phone] Office [phone]   Lucille Passy M 09/02/2020, 12:08 PM

## 2020-09-02 NOTE — TOC Initial Note (Signed)
Transition of Care [facility]) - Initial/Assessment Note    Patient Details  Name: John Fields MRN: [id number] Date of Birth: 12-13-1929  Transition of Care [facility]) CM/SW Contact:    Bary Castilla, LCSW Phone Number: [phone] 09/02/2020, 12:51 PM  Clinical Narrative:                   CSW met with patient to discuss PT recommendation of a SNF. Patient was aware of recommendation and in agreement with going to a ST SNF. CSW discussed the SNF process. Patient gave CSW permission to fax referrals out to local facilities.CSW answered questions about the SNF process and the next steps in the process. Patient requested that CSW follow up with his daughter Baker Janus because he wanted her to make the decision.  CSW spoke with Baker Janus and she was aware of the recommendation of a SNF. CSW informed Baker Janus that patient asked for her to make the decision. CSW provided Baker Janus with the Medicare.gov website as well as discuss the SNF process. Baker Janus confirmed that patient had not been to a SNF in the past.  TOC team will continue to assist with discharge planning needs.     Expected Discharge Plan: Skilled Nursing Facility Barriers to Discharge: Continued Medical Work up,SNF Pending bed offer,Insurance Authorization   Patient Goals and CMS Choice Patient states their goals for this hospitalization and ongoing recovery are:: To be able to go bak home CMS Medicare.gov Compare Post Acute Care list provided to:: Patient Choice offered to / list presented to : Patient  Expected Discharge Plan and Services Expected Discharge Plan: [place]                                              Prior Living Arrangements/Services     Patient language and need for interpreter reviewed:: Yes          Care giver support system in place?: Yes (comment)      Activities of Daily Living Home Assistive Devices/Equipment: Walker (specify type),Hearing aid,Eyeglasses,Dentures (specify type) (4 wheel  walker, full set dentures) ADL Screening (condition at time of admission) Patient's cognitive ability adequate to safely complete daily activities?: Yes Is the patient deaf or have difficulty hearing?: Yes Does the patient have difficulty seeing, even when wearing glasses/contacts?: Yes Does the patient have difficulty concentrating, remembering, or making decisions?: Yes Patient able to express need for assistance with ADLs?: Yes Does the patient have difficulty dressing or bathing?: Yes Independently performs ADLs?: No Communication: Independent Dressing (OT): Needs assistance Grooming: Needs assistance Feeding: Needs assistance Bathing: Needs assistance Toileting: Needs assistance In/Out Bed: Needs assistance Walks in Home: Needs assistance Does the patient have difficulty walking or climbing stairs?: Yes Weakness of Legs: Both Weakness of Arms/Hands: None  Permission Sought/Granted      Share Information with NAME: Baker Janus  Permission granted to share info w AGENCY: SNF  Permission granted to share info w Relationship: Daughter  Permission granted to share info w Contact Information: [phone]  Emotional Assessment Appearance:: Appears stated age Attitude/Demeanor/Rapport: Engaged Affect (typically observed): Accepting,Adaptable Orientation: : Oriented to Self,Oriented to Place,Oriented to  Time,Oriented to Situation      Admission diagnosis:  ST elevation myocardial infarction (STEMI), unspecified artery ([place]) [I21.3] STEMI (ST elevation myocardial infarction) [facility]) [I21.3] Patient Active Problem List   Diagnosis Date Noted  .  Protein-calorie malnutrition, severe 08/31/2020  . STEMI (ST elevation myocardial infarction) ([place]) 08/29/2020   PCP:  Patient, No Pcp Per Pharmacy:  No Pharmacies Listed    Social Determinants of Health (SDOH) Interventions    Readmission Risk Interventions No flowsheet data found.

## 2020-09-02 NOTE — NC FL2 (Signed)
[place] LEVEL OF CARE SCREENING TOOL     IDENTIFICATION  Patient Name: John Fields Birthdate: 10-15-1929 Sex: male Admission Date (Current Location): 08/29/2020  [facility] and [place] Number:  Herbalist and Address:  [facility], [place] [address], [place], [place] [address]      Provider Number: [id number]  Attending Physician Name and Address:  Belva Crome, MD  Relative Name and Phone Number:  Baker Janus [phone]    Current Level of Care: Hospital Recommended Level of Care: [place] Prior Approval Number:    Date Approved/Denied:   PASRR Number: [id number]  Discharge Plan: SNF    Current Diagnoses: Patient Active Problem List   Diagnosis Date Noted  . Protein-calorie malnutrition, severe 08/31/2020  . STEMI (ST elevation myocardial infarction) ([place]) 08/29/2020    Orientation RESPIRATION BLADDER Height & Weight     Self,Time,Situation,Place  O2 (3L) Incontinent,External catheter Weight: 121 lb 7.6 oz (55.1 kg) Height:  5\' 7"  (170.2 cm)  BEHAVIORAL SYMPTOMS/MOOD NEUROLOGICAL BOWEL NUTRITION STATUS      Continent Diet (See discharge summary)  AMBULATORY STATUS COMMUNICATION OF NEEDS Skin   Limited Assist Verbally Other (Comment) (Ecchymosis)                       Personal Care Assistance Level of Assistance  Bathing,Feeding,Dressing,Total care Bathing Assistance: Independent Feeding assistance: Limited assistance Dressing Assistance: Limited assistance Total Care Assistance: Limited assistance   Functional Limitations Info  Sight,Hearing,Speech Sight Info: Impaired Hearing Info: Impaired Speech Info: Adequate    SPECIAL CARE FACTORS FREQUENCY  PT (By licensed PT),OT (By licensed OT)     PT Frequency: 5x per week OT Frequency: 5x per week            Contractures Contractures Info: Not present    Additional Factors Info  Code Status,Allergies Code Status Info: Full Allergies  Info: Requip (Ropinirole), Statins           Current Medications (09/02/2020):  This is the current hospital active medication list Current Facility-Administered Medications  Medication Dose Route Frequency Provider Last Rate Last Admin  . 0.9 %  sodium chloride infusion   Intravenous Continuous [place], Bhavinkumar, PA   Stopped at 08/30/20 0817  . 0.9 %  sodium chloride infusion  250 mL Intravenous PRN Bhagat, Bhavinkumar, PA      . acetaminophen (TYLENOL) tablet 650 mg  650 mg Oral Q4H PRN Bhagat, Bhavinkumar, PA      . albuterol (VENTOLIN HFA) 108 (90 Base) MCG/ACT inhaler 1-2 puff  1-2 puff Inhalation Q4H PRN Kipp Brood, MD      . aspirin EC tablet 81 mg  81 mg Oral Daily Bhagat, Bhavinkumar, PA   81 mg at 09/02/20 0908  . busPIRone (BUSPAR) tablet 15 mg  15 mg Oral Daily Bhagat, Bhavinkumar, PA   15 mg at 09/02/20 0908  . ezetimibe (ZETIA) tablet 10 mg  10 mg Oral Daily Bhagat, Bhavinkumar, PA   10 mg at 09/02/20 0908  . feeding supplement (ENSURE ENLIVE / ENSURE PLUS) liquid 237 mL  237 mL Oral BID BM Bhagat, Bhavinkumar, PA   237 mL at 09/01/20 1357  . guaiFENesin-dextromethorphan (ROBITUSSIN DM) 100-10 MG/5ML syrup 5 mL  5 mL Oral Q4H PRN Roby Lofts M., PA-C   5 mL at 09/01/20 1111  . heparin injection 5,000 Units  5,000 Units Subcutaneous Q8H Bhagat, Bhavinkumar, PA   5,000 Units at 09/02/20 [id number]  .  LORazepam (ATIVAN) tablet 0.25-0.5 mg  0.25-0.5 mg Oral QHS PRN Bhagat, Bhavinkumar, PA   0.5 mg at 09/01/20 2113  . losartan (COZAAR) tablet 25 mg  25 mg Oral Daily Lewayne Bunting, MD   25 mg at 09/02/20 0909  . metoprolol succinate (TOPROL-XL) 24 hr tablet 25 mg  25 mg Oral Daily Lewayne Bunting, MD   25 mg at 09/02/20 0908  . nitroGLYCERIN (NITROSTAT) SL tablet 0.4 mg  0.4 mg Sublingual Q5 Min x 3 PRN Bhagat, Bhavinkumar, PA      . ondansetron (ZOFRAN) injection 4 mg  4 mg Intravenous Q6H PRN Bhagat, Bhavinkumar, PA      . oxyCODONE (Oxy IR/ROXICODONE) immediate release  tablet 5-10 mg  5-10 mg Oral Q4H PRN Bhagat, Bhavinkumar, PA   5 mg at 08/31/20 0507  . predniSONE (DELTASONE) tablet 40 mg  40 mg Oral Q breakfast Lynnell Catalan, MD   40 mg at 09/02/20 0908  . sodium chloride flush (NS) 0.9 % injection 3 mL  3 mL Intravenous Q12H Bhagat, Bhavinkumar, PA   3 mL at 09/01/20 2113  . sodium chloride flush (NS) 0.9 % injection 3 mL  3 mL Intravenous PRN Bhagat, Bhavinkumar, PA      . tamsulosin (FLOMAX) capsule 0.4 mg  0.4 mg Oral Daily Bhagat, Bhavinkumar, PA   0.4 mg at 09/02/20 0908  . ticagrelor (BRILINTA) tablet 90 mg  90 mg Oral BID Bhagat, Bhavinkumar, PA   90 mg at 09/02/20 0909  . umeclidinium bromide (INCRUSE ELLIPTA) 62.5 MCG/INH 1 puff  1 puff Inhalation Daily Lynnell Catalan, MD   1 puff at 09/02/20 8502     Discharge Medications: Please see discharge summary for a list of discharge medications.  Relevant Imaging Results:  Relevant Lab Results:   Additional Information SSN# [id number] patient vaccinated  Patrice Paradise, LCSW

## 2020-09-02 NOTE — Progress Notes (Signed)
Progress Note  Patient Name: John Fields Date of Encounter: 09/02/2020  [facility] Cardiologist: New  Subjective   Mildly dyspneic; no CP  Inpatient Medications    Scheduled Meds: . aspirin EC  81 mg Oral Daily  . busPIRone  15 mg Oral Daily  . ezetimibe  10 mg Oral Daily  . feeding supplement  237 mL Oral BID BM  . heparin injection (subcutaneous)  5,000 Units Subcutaneous Q8H  . metoprolol succinate  25 mg Oral Daily  . predniSONE  40 mg Oral Q breakfast  . sodium chloride flush  3 mL Intravenous Q12H  . tamsulosin  0.4 mg Oral Daily  . ticagrelor  90 mg Oral BID  . umeclidinium bromide  1 puff Inhalation Daily   Continuous Infusions: . sodium chloride Stopped (08/30/20 0817)  . sodium chloride     PRN Meds: sodium chloride, acetaminophen, albuterol, guaiFENesin-dextromethorphan, LORazepam, nitroGLYCERIN, ondansetron (ZOFRAN) IV, oxyCODONE, sodium chloride flush   Vital Signs    Vitals:   09/01/20 1641 09/01/20 2018 09/02/20 0018 09/02/20 0359  BP: (!) 97/55 138/70 125/65 125/67  Pulse: 93 91 78 78  Resp: 18 18    Temp: 98.2 F (36.8 C) 98.5 F (36.9 C) 98.3 F (36.8 C) 97.9 F (36.6 C)  TempSrc: Oral Oral Oral Oral  SpO2: 96% 97% 96% 95%  Weight:    55.1 kg  Height:        Intake/Output Summary (Last 24 hours) at 09/02/2020 0734 Last data filed at 09/02/2020 0359 Gross per 24 hour  Intake 359 ml  Output 900 ml  Net -541 ml   Last 3 Weights 09/02/2020 09/01/2020 08/31/2020  Weight (lbs) 121 lb 7.6 oz 122 lb 9.2 oz 125 lb 3.5 oz  Weight (kg) 55.1 kg 55.6 kg 56.8 kg      Telemetry    Sinus with run of PAT- Personally Reviewed   Physical Exam   GEN: WD frail NAD Neck: supple, no JVD Cardiac: RRR, no murmur Respiratory: Diminished BS; no wheeze GI: Soft, NT/ND MS: No edema Neuro:  Grossly intact Psych: Normal affect   Labs    High Sensitivity Troponin:   Recent Labs  Lab 08/29/20 1043 08/29/20 1415 08/29/20 1634 08/29/20 1835   TROPONINIHS 12,408* >27,000* >27,000* >27,000*      Chemistry Recent Labs  Lab 08/29/20 1043 08/29/20 1200 08/31/20 0145 09/01/20 0756 09/02/20 0344  NA 141   < > 143 142 140  K 3.8   < > 3.5 3.6 4.1  CL 106   < > 109 106 104  CO2 23   < > 25 26 26   GLUCOSE 113*   < > 105* 127* 118*  BUN 20   < > 32* 45* 59*  CREATININE 1.10   < > 1.24 1.22 1.23  CALCIUM 8.9   < > 8.8* 8.8* 8.8*  PROT 6.2*  --   --   --   --   ALBUMIN 3.3*  --   --   --   --   AST 91*  --   --   --   --   ALT 23  --   --   --   --   ALKPHOS 56  --   --   --   --   BILITOT 0.8  --   --   --   --   GFRNONAA >60   < > 56* 57* 56*  ANIONGAP 12   < > 9 10  10   < > = values in this interval not displayed.     Hematology Recent Labs  Lab 08/31/20 0145 09/01/20 0128 09/02/20 0344  WBC 9.3 6.6 10.0  RBC 3.02* 2.88* 2.79*  HGB 9.1* 8.7* 8.3*  HCT 29.2* 27.6* 26.5*  MCV 96.7 95.8 95.0  MCH 30.1 30.2 29.7  MCHC 31.2 31.5 31.3  RDW 14.6 14.6 14.6  PLT 103* 109* 126*    Radiology    No results found.  Patient Profile     87 y.o. male admitted with acute anterior myocardial infarction (late presentation). Patient suffered ventricular fibrillation arrest in the emergency room requiring transient CPR and defibrillation. Cardiac catheterization revealed occluded LAD and 70% right coronary artery. Left ventricular end-diastolic pressure 9 mmHg. Patient had PCI of the LAD. Echocardiogram showed ejection fraction 35 to [id number], grade 1 diastolic dysfunction, mild mitral regurgitation.  Assessment & Plan    1. Acute anterior ST elevation myocardial infarction-patient is status post PCI of LAD. Continue aspirin and Brilinta.  Intolerant to statins.  Blood pressure has improved.  Will resume losartan 25 mg daily and continue Toprol at present dose.  Will change losartan to [facility] later if blood pressure allows.  He is euvolemic on examination and BUN creatinine ratio increased.  No further diuresis at this point.  Patient suffered a cardiac arrest in the emergency room on the day of admission but this occurred in the setting of acute myocardial infarction.  No further arrhythmias and amiodarone discontinued.   2. Hypertension-blood pressure improved this morning.  Will resume losartan 25 mg daily for reduced LV function.  Follow blood pressure and titrate medications as needed for ischemic cardiomyopathy. 3. Hyperlipidemia-patient is intolerant to statins.   Continue Zetia.  Check lipids and liver 12 weeks after discharge.  If LDL greater than 70 would consider Repatha. 4. Peripheral vascular disease-patient with history of lower extremity disease and previous carotid endarterectomy.  Continue aspirin and Zetia. 5. History of abdominal aortic aneurysm repair 6. Thyroid density-noted on chest x-ray-follow-up primary care. 7. COPD-pulmonary managing.  Patient feels as though he needs at least a rehabilitation stay.  Will ask case management to review for placement.   For questions or updates, please contact [place] Please consult [email or web address] for contact info under        Signed, Kirk Ruths, MD  09/02/2020, 7:34 AM

## 2020-09-03 DIAGNOSIS — I2102 ST elevation (STEMI) myocardial infarction involving left anterior descending coronary artery: Secondary | ICD-10-CM | POA: Diagnosis not present

## 2020-09-03 LAB — CBC
HCT: 26.6 % — ABNORMAL LOW (ref 39.0–52.0)
Hemoglobin: 8.8 g/dL — ABNORMAL LOW (ref 13.0–17.0)
MCH: 31.3 pg (ref 26.0–34.0)
MCHC: 33.1 g/dL (ref 30.0–36.0)
MCV: 94.7 fL (ref 80.0–100.0)
Platelets: 135 10*3/uL — ABNORMAL LOW (ref 150–400)
RBC: 2.81 MIL/uL — ABNORMAL LOW (ref 4.22–5.81)
RDW: 14.5 % (ref 11.5–15.5)
WBC: 8 10*3/uL (ref 4.0–10.5)
nRBC: 0 % (ref 0.0–0.2)

## 2020-09-03 LAB — GLUCOSE, CAPILLARY
Glucose-Capillary: 85 mg/dL (ref 70–99)
Glucose-Capillary: 99 mg/dL (ref 70–99)
Glucose-Capillary: 137 mg/dL — ABNORMAL HIGH (ref 70–99)
Glucose-Capillary: 144 mg/dL — ABNORMAL HIGH (ref 70–99)

## 2020-09-03 LAB — BASIC METABOLIC PANEL
Anion gap: 9 (ref 5–15)
BUN: 59 mg/dL — ABNORMAL HIGH (ref 8–23)
CO2: 25 mmol/L (ref 22–32)
Calcium: 8.9 mg/dL (ref 8.9–10.3)
Chloride: 106 mmol/L (ref 98–111)
Creatinine, Ser: 1.27 mg/dL — ABNORMAL HIGH (ref 0.61–1.24)
GFR, Estimated: 54 mL/min — ABNORMAL LOW (ref >60)
Glucose, Bld: 110 mg/dL — ABNORMAL HIGH (ref 70–99)
Potassium: 4.5 mmol/L (ref 3.5–5.1)
Sodium: 140 mmol/L (ref 135–145)

## 2020-09-03 MED ORDER — SACUBITRIL-VALSARTAN 24-26 MG PO TABS
1.0000 | ORAL_TABLET | Freq: Two times a day (BID) | ORAL | Status: DC
  Administered 2020-09-03 – 2020-09-04 (×3): 1 via ORAL
  Filled 2020-09-03 (×3): qty 1

## 2020-09-03 MED ORDER — METOPROLOL SUCCINATE ER 50 MG PO TB24
50.0000 mg | ORAL_TABLET | Freq: Every day | ORAL | Status: DC
Start: 2020-09-03 — End: 2020-09-04
  Administered 2020-09-03 – 2020-09-04 (×2): 50 mg via ORAL
  Filled 2020-09-03 (×2): qty 1

## 2020-09-03 NOTE — Progress Notes (Signed)
Progress Note  Patient Name: John Fields Date of Encounter: 09/03/2020  [facility] Cardiologist: New  Subjective   No recurrent CP; mild DOE  Inpatient Medications    Scheduled Meds: . aspirin EC  81 mg Oral Daily  . busPIRone  15 mg Oral Daily  . ezetimibe  10 mg Oral Daily  . feeding supplement  237 mL Oral BID BM  . heparin injection (subcutaneous)  5,000 Units Subcutaneous Q8H  . losartan  25 mg Oral Daily  . metoprolol succinate  25 mg Oral Daily  . predniSONE  40 mg Oral Q breakfast  . sodium chloride flush  3 mL Intravenous Q12H  . tamsulosin  0.4 mg Oral Daily  . ticagrelor  90 mg Oral BID  . umeclidinium bromide  1 puff Inhalation Daily   Continuous Infusions: . sodium chloride Stopped (08/30/20 0817)  . sodium chloride     PRN Meds: sodium chloride, acetaminophen, albuterol, guaiFENesin-dextromethorphan, loperamide, LORazepam, nitroGLYCERIN, ondansetron (ZOFRAN) IV, oxyCODONE, sodium chloride flush   Vital Signs    Vitals:   09/03/20 0050 09/03/20 0451 09/03/20 0721 09/03/20 0750  BP: (!) 146/83 (!) 143/67  (!) 166/92  Pulse: 72 74 77 78  Resp: 18 18 18    Temp: 98.2 F (36.8 C) 97.8 F (36.6 C)  97.8 F (36.6 C)  TempSrc: Oral Oral  Oral  SpO2: 100% 97% 99% 100%  Weight:  52.6 kg    Height:        Intake/Output Summary (Last 24 hours) at 09/03/2020 0753 Last data filed at 09/03/2020 0454 Gross per 24 hour  Intake 480 ml  Output 1400 ml  Net -920 ml   Last 3 Weights 09/03/2020 09/02/2020 09/01/2020  Weight (lbs) 115 lb 15.4 oz 121 lb 7.6 oz 122 lb 9.2 oz  Weight (kg) 52.6 kg 55.1 kg 55.6 kg      Telemetry    Sinus- Personally Reviewed   Physical Exam   GEN: NAD Neck: supple Cardiac: RRR Respiratory: Diminished BS; no rhonchi GI: Soft, NT/ND, no masses MS: No edema Neuro:  No focal findings Psych: Normal affect   Labs    High Sensitivity Troponin:   Recent Labs  Lab 08/29/20 1043 08/29/20 1415 08/29/20 1634 08/29/20 1835   TROPONINIHS 12,408* >27,000* >27,000* >27,000*      Chemistry Recent Labs  Lab 08/29/20 1043 08/29/20 1200 09/01/20 0756 09/02/20 0344 09/03/20 0226  NA 141   < > 142 140 140  K 3.8   < > 3.6 4.1 4.5  CL 106   < > 106 104 106  CO2 23   < > 26 26 25   GLUCOSE 113*   < > 127* 118* 110*  BUN 20   < > 45* 59* 59*  CREATININE 1.10   < > 1.22 1.23 1.27*  CALCIUM 8.9   < > 8.8* 8.8* 8.9  PROT 6.2*  --   --   --   --   ALBUMIN 3.3*  --   --   --   --   AST 91*  --   --   --   --   ALT 23  --   --   --   --   ALKPHOS 56  --   --   --   --   BILITOT 0.8  --   --   --   --   GFRNONAA >60   < > 57* 56* 54*  ANIONGAP 12   < >  10 10 9    < > = values in this interval not displayed.     Hematology Recent Labs  Lab 09/01/20 0128 09/02/20 0344 09/03/20 0226  WBC 6.6 10.0 8.0  RBC 2.88* 2.79* 2.81*  HGB 8.7* 8.3* 8.8*  HCT 27.6* 26.5* 26.6*  MCV 95.8 95.0 94.7  MCH 30.2 29.7 31.3  MCHC 31.5 31.3 33.1  RDW 14.6 14.6 14.5  PLT 109* 126* 135*    Patient Profile     87 y.o. male admitted with acute anterior myocardial infarction (late presentation). Patient suffered ventricular fibrillation arrest in the emergency room requiring transient CPR and defibrillation. Cardiac catheterization revealed occluded LAD and 70% right coronary artery. Left ventricular end-diastolic pressure 9 mmHg. Patient had PCI of the LAD. Echocardiogram showed ejection fraction 35 to 40%, grade 1 diastolic dysfunction, mild mitral regurgitation.  Assessment & Plan    1. Acute anterior ST elevation myocardial infarction-patient is status post PCI of LAD. Continue aspirin and Brilinta.  Intolerant to statins.  Blood pressure has improved and now elevated.  Discontinue losartan and treat with Entresto 24/26 twice daily.  Increase Toprol to 50 mg daily.  Patient remains euvolemic on examination and BUN/creatinine ratio increase.  Will not diurese further.  Note he suffered a cardiac arrest in the emergency room at  the time of admission.  However this was in the setting of his acute myocardial infarction and he has had no further arrhythmias.  Amiodarone has been discontinued.  2. Ischemic cardiomyopathy-change losartan to Entresto and advance beta-blocker as tolerated. 3. Hypertension-blood pressure elevated this morning.  As above we will change losartan to Entresto and increase Toprol.  Follow blood pressure and advance medications as needed. 4. Hyperlipidemia-patient is intolerant to statins.   Continue Zetia.  Check lipids and liver 12 weeks after discharge.  If LDL greater than 70 would consider Repatha. 5. Peripheral vascular disease-patient with history of lower extremity disease and previous carotid endarterectomy.  Continue aspirin and Zetia. 6. History of abdominal aortic aneurysm repair 7. Thyroid density-noted on chest x-ray-follow-up primary care. 8. COPD-pulmonary managing.  Awaiting placement for rehabilitation.   For questions or updates, please contact [facility] Please consult [email or web address] for contact info under        Signed, 87, MD  09/03/2020, 7:53 AM

## 2020-09-04 ENCOUNTER — Inpatient Hospital Stay (HOSPITAL_COMMUNITY): Payer: Medicare Other

## 2020-09-04 ENCOUNTER — Inpatient Hospital Stay: Payer: Self-pay

## 2020-09-04 DIAGNOSIS — E43 Unspecified severe protein-calorie malnutrition: Secondary | ICD-10-CM

## 2020-09-04 DIAGNOSIS — I5021 Acute systolic (congestive) heart failure: Secondary | ICD-10-CM | POA: Diagnosis not present

## 2020-09-04 DIAGNOSIS — I2102 ST elevation (STEMI) myocardial infarction involving left anterior descending coronary artery: Secondary | ICD-10-CM | POA: Diagnosis not present

## 2020-09-04 DIAGNOSIS — R0602 Shortness of breath: Secondary | ICD-10-CM | POA: Diagnosis not present

## 2020-09-04 DIAGNOSIS — I739 Peripheral vascular disease, unspecified: Secondary | ICD-10-CM

## 2020-09-04 DIAGNOSIS — I952 Hypotension due to drugs: Secondary | ICD-10-CM

## 2020-09-04 DIAGNOSIS — I25110 Atherosclerotic heart disease of native coronary artery with unstable angina pectoris: Secondary | ICD-10-CM

## 2020-09-04 LAB — BASIC METABOLIC PANEL
Anion gap: 13 (ref 5–15)
BUN: 66 mg/dL — ABNORMAL HIGH (ref 8–23)
CO2: 25 mmol/L (ref 22–32)
Calcium: 9.2 mg/dL (ref 8.9–10.3)
Chloride: 99 mmol/L (ref 98–111)
Creatinine, Ser: 1.62 mg/dL — ABNORMAL HIGH (ref 0.61–1.24)
GFR, Estimated: 41 mL/min — ABNORMAL LOW (ref >60)
Glucose, Bld: 137 mg/dL — ABNORMAL HIGH (ref 70–99)
Potassium: 4.2 mmol/L (ref 3.5–5.1)
Sodium: 137 mmol/L (ref 135–145)

## 2020-09-04 LAB — CBC WITH DIFFERENTIAL/PLATELET
Abs Immature Granulocytes: 0.11 10*3/uL — ABNORMAL HIGH (ref 0.00–0.07)
Basophils Absolute: 0 10*3/uL (ref 0.0–0.1)
Basophils Relative: 0 %
Eosinophils Absolute: 0 10*3/uL (ref 0.0–0.5)
Eosinophils Relative: 0 %
HCT: 38.2 % — ABNORMAL LOW (ref 39.0–52.0)
Hemoglobin: 12 g/dL — ABNORMAL LOW (ref 13.0–17.0)
Immature Granulocytes: 1 %
Lymphocytes Relative: 5 %
Lymphs Abs: 0.7 10*3/uL (ref 0.7–4.0)
MCH: 29.6 pg (ref 26.0–34.0)
MCHC: 31.4 g/dL (ref 30.0–36.0)
MCV: 94.3 fL (ref 80.0–100.0)
Monocytes Absolute: 0.7 10*3/uL (ref 0.1–1.0)
Monocytes Relative: 5 %
Neutro Abs: 12.2 10*3/uL — ABNORMAL HIGH (ref 1.7–7.7)
Neutrophils Relative %: 89 %
Platelets: 251 10*3/uL (ref 150–400)
RBC: 4.05 MIL/uL — ABNORMAL LOW (ref 4.22–5.81)
RDW: 14.3 % (ref 11.5–15.5)
WBC: 13.8 10*3/uL — ABNORMAL HIGH (ref 4.0–10.5)
nRBC: 0 % (ref 0.0–0.2)

## 2020-09-04 LAB — COOXEMETRY PANEL
Carboxyhemoglobin: 1.4 % (ref 0.5–1.5)
Methemoglobin: 0.8 % (ref 0.0–1.5)
O2 Saturation: 57.3 %
Total hemoglobin: 11.1 g/dL — ABNORMAL LOW (ref 12.0–16.0)

## 2020-09-04 LAB — URINALYSIS, ROUTINE W REFLEX MICROSCOPIC
Bilirubin Urine: NEGATIVE
Glucose, UA: NEGATIVE mg/dL
Hgb urine dipstick: NEGATIVE
Ketones, ur: NEGATIVE mg/dL
Leukocytes,Ua: NEGATIVE
Nitrite: NEGATIVE
Protein, ur: NEGATIVE mg/dL
Specific Gravity, Urine: 1.012 (ref 1.005–1.030)
pH: 9 — ABNORMAL HIGH (ref 5.0–8.0)

## 2020-09-04 LAB — CBC
HCT: 30.8 % — ABNORMAL LOW (ref 39.0–52.0)
Hemoglobin: 9.9 g/dL — ABNORMAL LOW (ref 13.0–17.0)
MCH: 30.2 pg (ref 26.0–34.0)
MCHC: 32.1 g/dL (ref 30.0–36.0)
MCV: 93.9 fL (ref 80.0–100.0)
Platelets: 157 10*3/uL (ref 150–400)
RBC: 3.28 MIL/uL — ABNORMAL LOW (ref 4.22–5.81)
RDW: 14.1 % (ref 11.5–15.5)
WBC: 9.7 10*3/uL (ref 4.0–10.5)
nRBC: 0 % (ref 0.0–0.2)

## 2020-09-04 LAB — MRSA PCR SCREENING: MRSA by PCR: NEGATIVE

## 2020-09-04 LAB — LACTIC ACID, PLASMA
Lactic Acid, Venous: 2.8 mmol/L (ref 0.5–1.9)
Lactic Acid, Venous: 2.7 mmol/L (ref 0.5–1.9)

## 2020-09-04 LAB — GLUCOSE, CAPILLARY
Glucose-Capillary: 82 mg/dL (ref 70–99)
Glucose-Capillary: 94 mg/dL (ref 70–99)
Glucose-Capillary: 207 mg/dL — ABNORMAL HIGH (ref 70–99)

## 2020-09-04 LAB — ECHOCARDIOGRAM LIMITED
Height: 67 in
Weight: 1925.94 oz

## 2020-09-04 MED ORDER — SODIUM CHLORIDE 0.9% FLUSH
10.0000 mL | Freq: Two times a day (BID) | INTRAVENOUS | Status: DC
  Administered 2020-09-04 – 2020-09-07 (×4): 10 mL
  Administered 2020-09-07 – 2020-09-08 (×2): 30 mL
  Administered 2020-09-08 – 2020-09-12 (×8): 10 mL

## 2020-09-04 MED ORDER — CLONAZEPAM 0.5 MG PO TABS
0.5000 mg | ORAL_TABLET | Freq: Two times a day (BID) | ORAL | Status: DC | PRN
  Administered 2020-09-04 – 2020-09-12 (×8): 0.5 mg via ORAL
  Filled 2020-09-04 (×8): qty 1

## 2020-09-04 MED ORDER — SPIRONOLACTONE 12.5 MG HALF TABLET
12.5000 mg | ORAL_TABLET | Freq: Every day | ORAL | Status: DC
  Administered 2020-09-04: 12.5 mg via ORAL
  Filled 2020-09-04: qty 1

## 2020-09-04 MED ORDER — NOREPINEPHRINE 4 MG/250ML-% IV SOLN
2.0000 ug/min | INTRAVENOUS | Status: DC
  Administered 2020-09-04: 10 ug/min via INTRAVENOUS
  Filled 2020-09-04 (×2): qty 250

## 2020-09-04 MED ORDER — SODIUM CHLORIDE 0.9% FLUSH
10.0000 mL | INTRAVENOUS | Status: DC | PRN
  Administered 2020-09-05: 22:00:00 10 mL

## 2020-09-04 MED ORDER — NOREPINEPHRINE 4 MG/250ML-% IV SOLN: 0.0000 ug/min | INTRAVENOUS | Status: DC

## 2020-09-04 MED ORDER — SODIUM CHLORIDE 0.9 % IV SOLN
2.0000 g | INTRAVENOUS | Status: DC
  Administered 2020-09-04 – 2020-09-06 (×3): 2 g via INTRAVENOUS
  Filled 2020-09-04 (×3): qty 2

## 2020-09-04 MED ORDER — FUROSEMIDE 10 MG/ML IJ SOLN
40.0000 mg | Freq: Once | INTRAMUSCULAR | Status: AC
  Administered 2020-09-04: 40 mg via INTRAVENOUS
  Filled 2020-09-04: qty 4

## 2020-09-04 MED ORDER — CHLORHEXIDINE GLUCONATE CLOTH 2 % EX PADS
6.0000 | MEDICATED_PAD | Freq: Every day | CUTANEOUS | Status: DC
  Administered 2020-09-05 – 2020-09-12 (×8): 6 via TOPICAL

## 2020-09-04 MED ORDER — LORAZEPAM 0.5 MG PO TABS: 0.2500 mg | ORAL_TABLET | Freq: Four times a day (QID) | ORAL | Status: DC | PRN

## 2020-09-04 MED ORDER — SODIUM CHLORIDE 0.9 % IV SOLN
250.0000 mL | INTRAVENOUS | Status: DC
  Administered 2020-09-04: 19:00:00 250 mL via INTRAVENOUS

## 2020-09-04 MED ORDER — FUROSEMIDE 40 MG PO TABS: 40.0000 mg | ORAL_TABLET | Freq: Every day | ORAL | Status: DC

## 2020-09-04 MED ORDER — GUAIFENESIN-DM 100-10 MG/5ML PO SYRP
5.0000 mL | ORAL_SOLUTION | Freq: Four times a day (QID) | ORAL | Status: DC
  Administered 2020-09-04 – 2020-09-12 (×32): 5 mL via ORAL
  Filled 2020-09-04 (×32): qty 5

## 2020-09-04 NOTE — Progress Notes (Signed)
[id number] 87 SR 109/75, sats at 95%. Discussed with cardiologist and ok to get to recliner. No recliner in room. Tried to locate one. ECHO here now. Will follow up later. Luetta Nutting RN BSN 09/04/2020 10:20 AM

## 2020-09-04 NOTE — Progress Notes (Signed)
Echocardiogram 2D Echocardiogram has been performed.  John Fields John Fields 09/04/2020, 10:43 AM

## 2020-09-04 NOTE — Progress Notes (Signed)
PICC line placed. CVP is low, CoOx is mildly decreased at 57%. Norepi IV is now at 2 mcg. No additional diuretics. Try to gradually reintroduce Entresto if renal function does not deteriorate further. Hold off beta blockers for the time being.

## 2020-09-04 NOTE — Progress Notes (Addendum)
Showed rapid deterioration around 1300h with increased anxiety and worsened dyspnea, followed by marked and persistent hypotension (as low as 60s/40s). Continues to have a cough (a little more "wet" sounding" and has chest discomfort with coughing. Remains alert and responds appropriately to questions, but a little groggy. Bilateral ecchymoses over rib cage (following CPR). Bilateral rales No JVD. Fair UO after furosemide. Metoprolol, entresto, spironolactone stopped and IV norepi started.  BP improved 129/73 on norepi 10 mcg/min. Echo performed a few hours ago does not show a pericardial effusion. The base of the LV contracts normally, but the apex is poorly seen (dyskinetic on image loop 15). EF probably <40%. Has persistent ST elevation across the entire precordium, consistent with aneurysm formation. Chest pain with coughing could be due to CPR (or post MI pericarditis).  I remain concerned about the risk of free wall rupture, in which case prognosis would be grim. Will get PICC line for CVP/CoOx monitoring and pressor administration. Consult advanced HF team. His prognosis remains very guarded due to his age and late presentation with extensive anterior MI (occluded ostial LAD). Updated his daughter Dondra Spry) about his condition.  Reviewed care plan with the patient. He firmly stated that he doe not want to undergo intubation/mechanical ventilation, but is OK with CPR/defibrillation/pressor medications. Will change status to DNI (do not intubate).  Thurmon Fair, MD, [facility] [phone] office [phone] pager  ADDENDUM: note increased WBC. Check portable CXR and cultures.

## 2020-09-04 NOTE — Progress Notes (Signed)
Peripherally Inserted Central Catheter Placement  The IV Nurse has discussed with the patient and/or persons authorized to consent for the patient, the purpose of this procedure and the potential benefits and risks involved with this procedure.  The benefits include less needle sticks, lab draws from the catheter, and the patient may be discharged home with the catheter. Risks include, but not limited to, infection, bleeding, blood clot (thrombus formation), and puncture of an artery; nerve damage and irregular heartbeat and possibility to perform a PICC exchange if needed/ordered by physician.  Alternatives to this procedure were also discussed.  Bard Power PICC patient education guide, fact sheet on infection prevention and patient information card has been provided to patient /or left at bedside.    PICC Placement Documentation  PICC Triple Lumen 09/04/20 PICC Right Brachial 35 cm 0 cm (Active)  Indication for Insertion or Continuance of Line Vasoactive infusions 09/04/20 1623  Exposed Catheter (cm) 0 cm 09/04/20 1623  Site Assessment Clean;Dry;Intact 09/04/20 1623  Lumen #1 Status Flushed;Saline locked;Blood return noted 09/04/20 1623  Lumen #2 Status Flushed;Saline locked;Blood return noted 09/04/20 1623  Lumen #3 Status Flushed;Saline locked;Blood return noted 09/04/20 1623  Dressing Type Transparent 09/04/20 1623  Dressing Status Clean;Dry;Intact 09/04/20 1623  Antimicrobial disc in place? Yes 09/04/20 1623  Dressing Intervention New dressing 09/04/20 1623  Dressing Change Due 09/11/20 09/04/20 1623       Ethelda Chick 09/04/2020, 4:25 PM

## 2020-09-04 NOTE — Progress Notes (Signed)
CARDIAC REHAB PHASE I   PRE:  Rate/Rhythm: 88 SR  BP:  Supine: 124/65  Sitting:   Standing:    SaO2: 95% 1.5L  MODE:  Ambulation: chair ft   POST:  Rate/Rhythm: 88 SR  BP:  Supine:   Sitting: 106/83  Standing:    SaO2: 95% 1.5L 1120-1135 Assisted pt to recliner with asst x 2. Pt pivoted well but this little bit of activity caused some DOE. Encouraged pt to take slow deep breaths. Sats ok. Call light in reach and chair alarm on.    Luetta Nutting, RN BSN  09/04/2020 11:32 AM

## 2020-09-04 NOTE — Progress Notes (Signed)
Progress Note  Patient Name: John Fields Date of Encounter: 09/04/2020  [facility] Cardiologist: Kirk Ruths, MD (New)  Subjective   No acute overnight events.  Feels short of breath with minimal activity. Has some chest tenderness with coughing, not with deep breathing. Waiting on SNF.  Inpatient Medications    Scheduled Meds: . aspirin EC  81 mg Oral Daily  . busPIRone  15 mg Oral Daily  . ezetimibe  10 mg Oral Daily  . feeding supplement  237 mL Oral BID BM  . heparin injection (subcutaneous)  5,000 Units Subcutaneous Q8H  . metoprolol succinate  50 mg Oral Daily  . predniSONE  40 mg Oral Q breakfast  . sacubitril-valsartan  1 tablet Oral BID  . sodium chloride flush  3 mL Intravenous Q12H  . tamsulosin  0.4 mg Oral Daily  . ticagrelor  90 mg Oral BID  . umeclidinium bromide  1 puff Inhalation Daily   Continuous Infusions: . sodium chloride Stopped (08/30/20 0817)  . sodium chloride     PRN Meds: sodium chloride, acetaminophen, albuterol, guaiFENesin-dextromethorphan, loperamide, LORazepam, nitroGLYCERIN, ondansetron (ZOFRAN) IV, oxyCODONE, sodium chloride flush   Vital Signs    Vitals:   09/03/20 1625 09/03/20 2100 09/04/20 0454 09/04/20 0744  BP: 107/78 117/63 (!) 146/85   Pulse: 81 79 79   Resp: 18 18 18    Temp: 97.8 F (36.6 C) 98.2 F (36.8 C) 97.8 F (36.6 C)   TempSrc: Oral Oral Oral   SpO2: 95% 99% 94% 94%  Weight:   54.6 kg   Height:        Intake/Output Summary (Last 24 hours) at 09/04/2020 0809 Last data filed at 09/04/2020 0459 Gross per 24 hour  Intake 480 ml  Output 1200 ml  Net -720 ml   Last 3 Weights 09/04/2020 09/03/2020 09/02/2020  Weight (lbs) 120 lb 5.9 oz 115 lb 15.4 oz 121 lb 7.6 oz  Weight (kg) 54.6 kg 52.6 kg 55.1 kg      Telemetry    NSR/borderline sinus tachycardia - Personally Reviewed  ECG    No new ECG tracing today. - Personally Reviewed  Physical Exam  Elderly, frail appearing GEN: No acute distress.    Neck: No JVD Cardiac: RRR, no murmurs, rubs. S3 gallop is present Respiratory: a few wheezes bilateraly GI: Soft, nontender, non-distended  MS: No edema; No deformity. Neuro:  Nonfocal  Psych: Normal affect   Labs    High Sensitivity Troponin:   Recent Labs  Lab 08/29/20 1043 08/29/20 1415 08/29/20 1634 08/29/20 1835  TROPONINIHS 12,408* >27,000* >27,000* >27,000*      Chemistry Recent Labs  Lab 08/29/20 1043 08/29/20 1200 09/01/20 0756 09/02/20 0344 09/03/20 0226  NA 141   < > 142 140 140  K 3.8   < > 3.6 4.1 4.5  CL 106   < > 106 104 106  CO2 23   < > 26 26 25   GLUCOSE 113*   < > 127* 118* 110*  BUN 20   < > 45* 59* 59*  CREATININE 1.10   < > 1.22 1.23 1.27*  CALCIUM 8.9   < > 8.8* 8.8* 8.9  PROT 6.2*  --   --   --   --   ALBUMIN 3.3*  --   --   --   --   AST 91*  --   --   --   --   ALT 23  --   --   --   --  ALKPHOS 56  --   --   --   --   BILITOT 0.8  --   --   --   --   GFRNONAA >60   < > 57* 56* 54*  ANIONGAP 12   < > 10 10 9    < > = values in this interval not displayed.     Hematology Recent Labs  Lab 09/02/20 0344 09/03/20 0226 09/04/20 0150  WBC 10.0 8.0 9.7  RBC 2.79* 2.81* 3.28*  HGB 8.3* 8.8* 9.9*  HCT 26.5* 26.6* 30.8*  MCV 95.0 94.7 93.9  MCH 29.7 31.3 30.2  MCHC 31.3 33.1 32.1  RDW 14.6 14.5 14.1  PLT 126* 135* 157    BNP Recent Labs  Lab 08/30/20 0520  BNP 1,345.1*     DDimer No results for input(s): DDIMER in the last 168 hours.   Radiology    No results found.  Cardiac Studies   Cardiac Catheterization 08/29/2020:  A stent was successfully placed.    Late presenting (greater than 30 hours) anteroapical infarction complicated by ventricular fibrillation in the emergency department after the initial evaluation.  Successful PCI of proximal to mid total occlusion of the LAD reducing the stenosis to 0% with TIMI grade III flow.  There is eccentric 50% stenosis proximal to the stent but without evidence of  dissection noted.  The large 1st diagonal was not jailed.  Patent left main  Relatively small but patent circumflex.  Very tortuous right coronary supplying collaterals to the mid and apical LAD via septal perforators.  The mid RCA contains a 70 to 80% stenosis beyond region of significant tortuosity.  Anteroapical akinesis.  LVEDP 9 mmHg.  Acute systolic heart failure with normal filling pressure.  Recommendations:  Because of the late presenting nature of the patient's infarction, IV heparin will be resumed.  2D Doppler echocardiogram will be done in a.m. to assess for the presence of apical thrombus.  If none is present heparin can be discontinued.  Continue dual antiplatelet therapy.  Optimize medical therapy to achieve guideline directed therapy for systolic dysfunction  Aggressive risk factor modification including consideration of PCSK9 therapy for lipid control Diagnostic Dominance: Right    Intervention    _______________  Echocardiogram 08/29/2020: Impressions: 1. Left ventricular ejection fraction, by estimation, is 35 to 40%. The  left ventricle has moderately decreased function. The left ventricle  demonstrates regional wall motion abnormalities (see scoring  diagram/findings for description). Left ventricular  diastolic parameters are consistent with Grade I diastolic dysfunction  (impaired relaxation). Elevated left atrial pressure.  2. Right ventricular systolic function is normal. The right ventricular  size is normal. There is mildly elevated pulmonary artery systolic  pressure. The estimated right ventricular systolic pressure is 36.4 mmHg.  3. The mitral valve is normal in structure. Mild mitral valve  regurgitation. No evidence of mitral stenosis.  4. The aortic valve is tricuspid. Aortic valve regurgitation is not  visualized. Mild aortic valve sclerosis is present, with no evidence of  aortic valve stenosis.  5. The inferior vena cava is  normal in size with greater than 50%  respiratory variability, suggesting right atrial pressure of 3 mmHg.  Patient Profile     87 y.o. male with a history of carotid artery disease, PVD with previous AAA repair followed at [facility], COPD, hypertension, hyperlipidemia, and prostate cancer who presented on 12/27/2021with chest pain and shortness of breath and was found to have an acute anterior myocardial infarction. Of note, while in  the ED, patient suffered ventricular fibrillation cardiac arrest and was treated with Epinephrine 1mg  and was defibrillated. He received a total of 2 minutes of CPR with ROSC and then was taken to the cath lab.  Assessment & Plan    Acute Anterior STEMI  - High-sensitivity troponin >27,000. - Cardiac cath showed proximal to mid total occlusion of LAD treated with DES. Eccentric 50% stenosis proximal tot he stent but no evidence of dissection. Very tortuous RCA supplying collaterals to mid and apical LAD. 70-80% stenosis of mid RCA beyond region of significant tortuosity.  - Echo showed LVEF of 35-40% with dyskinesis of entire apex and multiple wall motion abnormalities (see above).   - Continue DAPT with Aspirin and Brilinta. Continue beta-blocker and high-intensity statin.  Ventricular Fibrillation Cardiac Arrest - Secondary to late presenting acute anterior STEMI. Treated with IV Epinephrine 1mg  and was defibrillator. Received total of 2 minutes of CPR before ROSC.  - Initially started on IV Amiodarone but this has been discontinued given arrest occurred in setting of acute MI.  - No recurrent VF or VT.   Ischemic Cardiomyopathy - Echo with LVEF of 35-40% with multiple wall motion abnormalities as above.  - Home Lisinopril discontinued and patient started on Losartan and then transitioned to Entresto 24-26mg  twice daily yesterday.  - Continue Toprol-XL 50mg  daily. - Continue Entresto - Continue to monitor volume status closely.  COPD Exacerbation - Treated with  Solumedrol with improvement. - Now currently on 5 day course of Prednisone. - Follow-up with PCP as outpatient. May need to follow-up with Pulmonology as well.  Hypertension - BP mildly elevated at times.  - Continue Entresto and Toprol-XL as above.  Hyperlipidemia - Lipid panel this admission: Total Cholesterol 153, Triglycerides 74, HDL 45, LDL 93.  - LDL goal <70 given CAD.  - Patient intolerant to statins. Started on Zetia 10mg  daily. Will need repeat lipid panel and LFTs in 12 weeks. If LDL still above goal, would consider Repatha.  PVD - History of lower extremity disease and previous carotid endarterectomy. - Continue Aspirin and Zetia.  History of AAA Repair - Followed at [facility]   Thyroid Density - Noted on chest x-ray. - Follow-up with PCP.   For questions or updates, please contact [place] Please consult [email or web address] for contact info under        Signed, Darreld Mclean, PA-C  09/04/2020, 8:09 AM    I have seen and examined the patient along with Darreld Mclean, PA-C .  I have reviewed the chart, notes and new data.  I agree with PA/NP's note.  Key new complaints: dyspnea with minimal activity, possible pleuritic discomfort Key examination changes: S3 present, wheezing. No rub. No JVD/edema. Key new findings / data: stable K and renal function. BNP>1300 (before Entresto). No arrhythmia on telemetry. Still had ST elevation on ECG yesterday  PLAN: Start diuretics (daily furosemide and low dose spironolactone) Recheck ECG (has persistent ST elevation) and limited echo for post MI pericarditis, risk of mechanical complications due to late presentation and steroid treatment.  Sanda Klein, MD, [place] [phone] 09/04/2020, 9:14 AM

## 2020-09-04 NOTE — Progress Notes (Signed)
   09/04/20 1400  Assess: MEWS Score  BP (!) 76/52  Pulse Rate 95  ECG Heart Rate 91  SpO2 91 %  O2 Device Nasal Cannula  O2 Flow Rate (L/min) 5 L/min  Assess: MEWS Score  MEWS Temp 0  MEWS Systolic 2  MEWS Pulse 0  MEWS RR 0  MEWS LOC 0  MEWS Score 2  MEWS Score Color Yellow  Assess: if the MEWS score is Yellow or Red  Were vital signs taken at a resting state? Yes  Focused Assessment Change from prior assessment (see assessment flowsheet)  Early Detection of Sepsis Score *See Row Information* Low  MEWS guidelines implemented *See Row Information* Yes  Treat  MEWS Interventions Escalated (See documentation below)  Take Vital Signs  Increase Vital Sign Frequency  Yellow: Q 2hr X 2 then Q 4hr X 2, if remains yellow, continue Q 4hrs  Escalate  MEWS: Escalate Yellow: discuss with charge nurse/RN and consider discussing with provider and RRT  Notify: Charge Nurse/RN  Name of Charge Nurse/RN Notified Kim  Date Charge Nurse/RN Notified 09/04/20  Time Charge Nurse/RN Notified 1400  Notify: Provider  Provider Name/Title Dr. Salena Saner  Date Provider Notified 09/04/20  Time Provider Notified 1405  Notification Type Page  Notification Reason Change in status  Response See new orders  Date of Provider Response 09/04/20  Time of Provider Response 1408  Notify: Rapid Response  Name of Rapid Response RN Notified [facility]  Date Rapid Response Notified 09/04/20  Time Rapid Response Notified 1400  Document  Patient Outcome Transferred/level of care increased  Progress note created (see row info) Yes

## 2020-09-04 NOTE — Consult Note (Addendum)
Advanced Heart Failure Team Consult Note   Primary Physician: Patient, No Pcp Per PCP-Cardiologist:  Olga MillersBrian Crenshaw, MD  Reason for Consultation: Hypotension  HPI:    John Fields is 87 y.o. M PMH: HTN, HLD, CAD, PVD, EAVR s/p AAA repair 2010, prostate CA, COPD presenting on 12/28 with chest pain and shortness of breath that had started on 12/26.  John Fields reports waking up on Sunday with indigestion and took aspirin then it went away.  He woke up again on Monday with similar symptoms which he took aspirin for and it also went away. Then on Monday he went to urgent care which sent him to the ED. John Fields was noted to have ST elevation in the anterior leads started on ASA, heparin and beta blocker but no statin due to intolerance.  At one point in the ED he was noted to have a vfib arrest requiring epi x 1, CPR for 2 minutes and defibrillation.  He was then taken to cardiac cath which showed proximal to mid total occlusion of LAD treated with DES. Eccentric 50% stenosis proximal tot he stent but no evidence of dissection. Very tortuous RCA supplying collaterals to mid and apical LAD. 70-80% stenosis of mid RCA beyond region of significant tortuosity.  Echo on 12/28 revealed EF 35-40% with grade I DD and regional wall motion abnormalities.  It was anticipated that he would likely need rehab, however, today at 1300 started to become anxious with worsening dyspnea. John Fields reports having panic attacks due to anxiety, reports he was told it was related to aging.  He denies shortness of breath or chest pain today, but states that he does cough at times which is productive and will make his chest hurt.  He denies pleuritic chest pain.  He was found to be hypotensive with SBP 70s with a wet cough.  John Fields also reports having surgery on his esophagus and at times he has a hard time swallowing his food and he will need to vomit.  He denies this occurring today.  Today, repeat echo showed EF 60-65% and  EKG showed ST elevation in V2-V5 leads. He was transferred to CVICU for hypotension and started on levophed.    John Fields was seen today for evaluation of hypotension at the request of Cards.    Review of Systems: [y] = yes, [ ]  = no   . General: Weight gain [n ]; Weight loss [n ]; Anorexia [ ] ; Fatigue Cove.Etienne[y ]; Fever [ n]; Chills [ n]; Weakness [ ]   . Cardiac: Chest pain/pressure [n ]; Resting SOB [n ]; Exertional SOB [n ]; Orthopnea [n ]; Pedal Edema [n ]; Palpitations [ ] ; Syncope [ ] ; Presyncope [ ] ; Paroxysmal nocturnal dyspnea[ ]   . Pulmonary: Cough [ y]; Wheezing[ ] ; Hemoptysis[ ] ; Sputum Cove.Etienne[y ]; Snoring [ ]   . GI: Vomiting[n ]; Dysphagia[n ]; Melena[ ] ; Hematochezia [ ] ; Heartburn[ ] ; Abdominal pain [ n]; Constipation [n ]; Diarrhea [n ]; BRBPR [ ]   . GU: Hematuria[ ] ; Dysuria [ ] ; Nocturia[ ]   . Vascular: Pain in legs with walking [ ] ; Pain in feet with lying flat [n ]; Non-healing sores [ ] ; Stroke [ ] ; TIA [ ] ; Slurred speech [ ] ;  . Neuro: Headaches[n ]; Vertigo[ ] ; Seizures[ ] ; Paresthesias[ ] ;Blurred vision [ ] ; Diplopia [ ] ; Vision changes [ ]   . Ortho/Skin: Arthritis [ ] ; Joint pain [ ] ; Muscle pain [ ] ; Joint swelling [ ] ; Back Pain [ ] ;  Rash [ ]   . Psych: Depression[ ] ; Anxiety[y ]  . Heme: Bleeding problems [ ] ; Clotting disorders [ ] ; Anemia [ ]   . Endocrine: Diabetes [ ] ; Thyroid dysfunction[ ]   Home Medications Prior to Admission medications   Medication Sig Start Date End Date Taking? Authorizing Provider  aspirin 81 MG EC tablet Take by mouth.   Yes [provider]  busPIRone (BUSPAR) 15 MG tablet Take 15 mg by mouth daily. 07/09/20  Yes [provider]  Cyanocobalamin 1000 MCG CAPS Take 1 tablet by mouth daily.   Yes [provider]  lisinopril (ZESTRIL) 40 MG tablet Take 40 mg by mouth daily. 07/09/20  Yes [provider]  LORazepam (ATIVAN) 0.5 MG tablet Take 0.25-0.5 mg by mouth at bedtime as needed for sleep. 05/14/20  Yes [provider]  metoprolol tartrate (LOPRESSOR) 100 MG tablet Take 50 mg by mouth 2 (two) times daily. 07/09/20  Yes [provider]  tamsulosin (FLOMAX) 0.4 MG CAPS capsule Take 0.4 mg by mouth daily. 07/27/20  Yes [provider]    Past Medical History: Past Medical History:  Diagnosis Date  . Abdominal aortic aneurysm (AAA) ([place])   . Anxiety   . Carotid artery disease ([place])   . COPD (chronic obstructive pulmonary disease) ([place])   . Hyperlipidemia   . Hypertension   . Peripheral vascular disease ([place])   . Prostate cancer [facility])     Past Surgical History: Past Surgical History:  Procedure Laterality Date  . ABDOMINAL AORTIC ANEURYSM REPAIR    . BACK SURGERY    . CAROTID ENDARTERECTOMY    . Cataract surgery    . CORONARY STENT INTERVENTION N/A 08/29/2020   Procedure: CORONARY STENT INTERVENTION;  Surgeon: Belva Crome, MD;  Location: [place] [facility];  Service: Cardiovascular;  Laterality: N/A;  . CORONARY/GRAFT ACUTE MI REVASCULARIZATION N/A 08/29/2020   Procedure: Coronary/Graft Acute MI Revascularization;  Surgeon: Belva Crome, MD;  Location: [place] [facility];  Service: Cardiovascular;  Laterality: N/A;  . LEFT HEART CATH AND CORONARY ANGIOGRAPHY N/A 08/29/2020   Procedure: LEFT HEART CATH AND CORONARY ANGIOGRAPHY;  Surgeon: Belva Crome, MD;  Location: [place] [facility];  Service: Cardiovascular;  Laterality: N/A;    Family History: Family History  Problem Relation Age of Onset  . Heart disease Brother        Valve replacement    Social History: Social History   Socioeconomic History  . Marital status: Widowed    Spouse name: Not on file  . Number of children: 1  . Years of education: Not on file  . Highest education level: Not on file  Occupational History  . Not on file  Tobacco Use  . Smoking status: Former Research scientist (life sciences)  . Smokeless tobacco: Never Used  Substance and Sexual Activity  . Alcohol use: Not Currently  . Drug use: Not on  file  . Sexual activity: Not on file  Other Topics Concern  . Not on file  Social History Narrative  . Not on file   Social Determinants of Health   Financial Resource Strain: Not on file  Food Insecurity: Not on file  Transportation Needs: Not on file  Physical Activity: Not on file  Stress: Not on file  Social Connections: Not on file    Allergies:  Allergies  Allergen Reactions  . Requip [Ropinirole] Other (See Comments)    insomnia  . Statins Diarrhea    Objective:    Vital  Signs:   Temp:  [97.8 F (36.6 C)-98.2 F (36.8 C)] 97.8 F (36.6 C) (01/03 0454) Pulse Rate:  [79-95] 93 (01/03 1425) Resp:  [18] 18 (01/03 0454) BP: (65-146)/(46-85) 101/67 (01/03 1425) SpO2:  [91 %-99 %] 95 % (01/03 1425) Weight:  [54.6 kg] 54.6 kg (01/03 0454) Last BM Date: 09/04/20  Weight change: Filed Weights   09/02/20 0359 09/03/20 0451 09/04/20 0454  Weight: 55.1 kg 52.6 kg 54.6 kg    Intake/Output:   Intake/Output Summary (Last 24 hours) at 09/04/2020 1437 Last data filed at 09/04/2020 0459 Gross per 24 hour  Intake --  Output 1200 ml  Net -1200 ml      Physical Exam    General:  Well appearing. No resp difficulty HEENT: normal.  Neck: supple. no JVD. No lymphadenopathy or thryomegaly appreciated. Cor: PMI nondisplaced. Regular rate & rhythm. No rubs, gallops or murmurs. Lungs: Clear bilaterally.  Abdomen: soft, nontender, nondistended. Good bowel sounds. Extremities: no cyanosis, clubbing, rash, edema Neuro: alert & oriented x 3, cranial nerves grossly intact. moves all 4 extremities w/o difficulty. Affect pleasant.   Telemetry   SR with rates 90s.  Personally reviewed.   EKG    SR with ST elevation to V2-V5.  Personally reviewed.    Labs   Basic Metabolic Panel: Recent Labs  Lab 08/30/20 0520 08/31/20 0145 09/01/20 0756 09/02/20 0344 09/03/20 0226  NA 144 143 142 140 140  K 3.6 3.5 3.6 4.1 4.5  CL 110 109 106 104 106  CO2 23 25 26 26 25   GLUCOSE  134* 105* 127* 118* 110*  BUN 23 32* 45* 59* 59*  CREATININE 1.11 1.24 1.22 1.23 1.27*  CALCIUM 8.6* 8.8* 8.8* 8.8* 8.9    Liver Function Tests: Recent Labs  Lab 08/29/20 1043  AST 91*  ALT 23  ALKPHOS 56  BILITOT 0.8  PROT 6.2*  ALBUMIN 3.3*   No results for input(s): LIPASE, AMYLASE in the last 168 hours. No results for input(s): AMMONIA in the last 168 hours.  CBC: Recent Labs  Lab 08/29/20 1043 08/29/20 1200 08/31/20 0145 09/01/20 0128 09/02/20 0344 09/03/20 0226 09/04/20 0150  WBC 9.2   < > 9.3 6.6 10.0 8.0 9.7  NEUTROABS 6.7  --   --   --   --   --   --   HGB 14.1   < > 9.1* 8.7* 8.3* 8.8* 9.9*  HCT 45.3   < > 29.2* 27.6* 26.5* 26.6* 30.8*  MCV 95.8   < > 96.7 95.8 95.0 94.7 93.9  PLT 104*   < > 103* 109* 126* 135* 157   < > = values in this interval not displayed.    Cardiac Enzymes: No results for input(s): CKTOTAL, CKMB, CKMBINDEX, TROPONINI in the last 168 hours.  BNP: BNP (last 3 results) Recent Labs    08/30/20 0520  BNP 1,345.1*    ProBNP (last 3 results) No results for input(s): PROBNP in the last 8760 hours.   CBG: Recent Labs  Lab 09/03/20 1138 09/03/20 1627 09/03/20 2155 09/04/20 0743 09/04/20 1110  GLUCAP 99 137* 144* 82 94    Coagulation Studies: No results for input(s): LABPROT, INR in the last 72 hours.   Imaging   ECHOCARDIOGRAM LIMITED  Result Date: 09/04/2020    ECHOCARDIOGRAM LIMITED REPORT   Patient Name:   AIMAN HECKSEL Date of Exam: 09/04/2020 Medical Rec #:  [id number]   Height:       67.0 in Accession #:  [id number]  Weight:       120.4 lb Date of Birth:  Dec 14, 1929  BSA:          1.630 m Patient Age:    87 years    BP:           109/75 mmHg Patient Gender: M           HR:           86 bpm. Exam Location:  Inpatient Procedure: Limited Echo, Color Doppler and Cardiac Doppler Indications:    I31.3 Pericardial effusion  History:        Patient has prior history of Echocardiogram examinations, most                 recent  08/29/2020. CAD, COPD; Risk Factors:Hypertension and                 Dyslipidemia.  Sonographer:    Irving Burton Senior RDCS Referring Phys: [phone] MIHAI CROITORU  Sonographer Comments: Technically difficult study due to COPD. IMPRESSIONS  1. Left ventricular ejection fraction, by estimation, is 60 to 65%. The left ventricle has normal function. The left ventricle has no regional wall motion abnormalities.  2. Right ventricular systolic function is normal. The right ventricular size is normal.  3. The mitral valve is normal in structure. No evidence of mitral valve regurgitation. No evidence of mitral stenosis.  4. The aortic valve is calcified. There is mild calcification of the aortic valve. There is mild thickening of the aortic valve. Aortic valve regurgitation is not visualized. No aortic stenosis is present.  5. The inferior vena cava is normal in size with greater than 50% respiratory variability, suggesting right atrial pressure of 3 mmHg. FINDINGS  Left Ventricle: Left ventricular ejection fraction, by estimation, is 60 to 65%. The left ventricle has normal function. The left ventricle has no regional wall motion abnormalities. The left ventricular internal cavity size was normal in size. There is  no left ventricular hypertrophy. Left ventricular diastolic function could not be evaluated due to nondiagnostic images. Right Ventricle: The right ventricular size is normal. No increase in right ventricular wall thickness. Right ventricular systolic function is normal. Left Atrium: Left atrial size was normal in size. Right Atrium: Right atrial size was normal in size. Pericardium: There is no evidence of pericardial effusion. Mitral Valve: The mitral valve is normal in structure. Mild mitral annular calcification. No evidence of mitral valve stenosis. Tricuspid Valve: The tricuspid valve is not well visualized. Aortic Valve: The aortic valve is calcified. There is mild calcification of the aortic valve. There is mild  thickening of the aortic valve. Aortic valve regurgitation is not visualized. No aortic stenosis is present. Pulmonic Valve: The pulmonic valve was not assessed. Aorta: The aortic root is normal in size and structure. Venous: The inferior vena cava is normal in size with greater than 50% respiratory variability, suggesting right atrial pressure of 3 mmHg. IAS/Shunts: No atrial level shunt detected by color flow Doppler. Lennie Odor MD Electronically signed by Lennie Odor MD Signature Date/Time: 09/04/2020/2:25:11 PM    Final    [place] EKG SITE RITE  Result Date: 09/04/2020 If Site Rite image not attached, placement could not be confirmed due to current cardiac rhythm.     Medications:     Current Medications: . aspirin EC  81 mg Oral Daily  . busPIRone  15 mg Oral Daily  . ezetimibe  10 mg Oral Daily  . feeding supplement  237 mL  Oral BID BM  . [START ON 09/05/2020] furosemide  40 mg Oral Daily  . heparin injection (subcutaneous)  5,000 Units Subcutaneous Q8H  . predniSONE  40 mg Oral Q breakfast  . sodium chloride flush  3 mL Intravenous Q12H  . ticagrelor  90 mg Oral BID  . umeclidinium bromide  1 puff Inhalation Daily     Infusions: . sodium chloride Stopped (08/30/20 0817)  . sodium chloride    . sodium chloride    . norepinephrine (LEVOPHED) Adult infusion 15 mcg/min (09/04/20 1422)       Patient Profile   John Fields is 87 y.o. M PMH: HTN, HLD, CAD, PVD, EAVR s/p AAA repair 2010, prostate CA, COPD presenting on 12/28 with chest pain and shortness of breath that had started on 12/26 found to have anterior MI requiring DES to the LAD.     Assessment/Plan   1. Acute Anterior STEMI  Troponin >27 K.  Cardiac cath showed proximal to mid total occlusion of LAD treated with DES. Eccentric 50% stenosis proximal to the stent but no evidence of dissection. Very tortuous RCA supplying collaterals to mid and apical LAD. 70-80% stenosis of mid RCA beyond region of significant  tortuosity. Echo on presentation showed LVEF of 35-40% with grade IDD, and wall motion abnormalities. Repeat limited echo 1/3 EF 60-65%.  - Continue DAPT with Aspirin and Brilinta.  - Hold beta-blocker 2. Ventricular Fibrillation Cardiac Arrest Secondary to late presenting acute anterior STEMI. Treated with IV Epinephrine 1mg  and defibrillated. Received total of 2 minutes of CPR before ROSC.  Initially started on IV Amiodarone but discontinued given arrest occurred in setting of acute MI.  - No recurrent VF or VT.  - Continue to monitor on tele 3. Ischemic Cardiomyopathy Echo with LVEF of 35-40% with multiple wall motion abnormalities. Repeat echo today, showed EF 60-65%. Initially, home lisinopril discontinued and patient started on Losartan and then transitioned to [facility]. BNP 1345.  - Hold Toprol-XL 50mg  daily and Entresto in setting of hypotension.  - Give lasix 40 IV x 1 today, then on lasix 40 daily.  Hold lasix for now.  - Continue to monitor volume status closely. 4. Hypotension Hx of Hypertension BP was mildly elevated at times during hospitalization but today found to by hypotensive SBP 70s.  Started on levophed.   - Hold Entresto and Toprol-XL as above. - Place PICC - Check CVP, co-ox, lactic acid, repeat CBC, BMP and CXR.  5. AKI Baseline Cr around 1, today Cr 1.62; UOP -1.2L over last 24H -Hold lasix for now. May need to give fluids a/w remaining diagnostic test.  -Monitor closely due to hypotension today 6. Bronchitis/Hypoxia Treated with Solumedrol for COPD exacerbation, patient unaware of diagnosis but had improvement and now on prednisone x 5 days (1/5).  Quit smoking 11 years ago.  Unable to find PFTs in EMR. Today, developed hypoxia requiring 5L n/c.  Denies pleuritic chest pain and now swelling on exam.  Reports cough productive at times and choking episodes at home last 5-6 years.   -Check CXR, may need to consider swallow evaluation -Try to keep SpO2 around 93%,  titrate O2 down.   - Follow-up with PCP as outpatient.  7. Hyperlipidemia - Lipid panel this admission: Total Cholesterol 153, Triglycerides 74, HDL 45, LDL 93.  LDL goal <70 given CAD. Patient intolerant to statins.  - Started on Zetia 10mg  daily. Will need repeat lipid panel and LFTs in 12 weeks. If LDL still above goal, would  consider Repatha. 8. Anemia/Thrombocytopenia Presenting hemoglobin 14.1 w/ plt 104, today Hgb 9.9 and plt 157 -Continue to monitor  9. PVD History of lower extremity disease and previous carotid endarterectomy. - Continue Aspirin and Zetia. 10. History of AAA Repair - Followed at [facility].  11. Thyroid Density - Noted on chest x-ray. Review on EMR showed u/s thyroid in 2016, 2019 without changes.  - Follow-up with PCP. 12. Anxiety -Continue home Buspar and lorazepam  Medication concerns reviewed with patient and pharmacy team.   Length of Stay: [place], NP  09/04/2020, 2:37 PM  Advanced Heart Failure Team Pager [phone] (M-F; 7a - 4p)  Please contact [facility] for night-coverage after hours (4p -7a ) and weekends on [email or web address]  Agree with above.   87 y/o male with COPD, PAD, previous AAA and anxiety admitted last week with anterior STEMI. Found to have totally occluded LAD -> s/p PCI/DES on 06/29/20. EF initially 35-40%. Has been progressing slowly. Today developed abrupt CP and SOB. Given IV lasix and subsequently developed hypotension with SBP 60-70s. Started on NE and moved to ICU. SBP now 130-140 range on 8 NE.   Echo: EF 50%. RV ok. No mechanical issues  Labs: WBC 9.7k -> 13.8k. Lactate 2.8  No F/C  General:  Elderly No resp difficulty HEENT: normal Neck: supple. no JVD. Carotids 2+ bilat; no bruits. No lymphadenopathy or thryomegaly appreciated. Cor: PMI nondisplaced. Regular rate & rhythm. No rubs, gallops or murmurs. Lungs: clear Abdomen: soft, nontender, nondistended. No hepatosplenomegaly. No bruits or masses. Good bowel  sounds. Extremities: no cyanosis, clubbing, rash, edema Neuro: alert & orientedx3, cranial nerves grossly intact. moves all 4 extremities w/o difficulty. Affect pleasant  Etiology of hypotension remains unclear. Echo without evidence of mechanical complications from MI. EF much better than I would have suspected given cath results, slow troponin washout and persistent ST elevation of ECG. Given rising WBC and lactic acidosis will get cultures and cover with empiric cefepime x 48 hours pending results of culture. Avoid diuresis. Can give some IVF back if CVP < 5. Can wean NE as tolerated. Follow CVP and co-ox with PICC.   Long discussion about code status and he clear wants DNR/DNI. Will provide anti-anxiety meds as well.   CRITICAL CARE Performed by: Glori Bickers  Total critical care time: 50 minutes  Critical care time was exclusive of separately billable procedures and treating other patients.  Critical care was necessary to treat or prevent imminent or life-threatening deterioration.  Critical care was time spent personally by me (independent of midlevel providers or residents) on the following activities: development of treatment plan with patient and/or surrogate as well as nursing, discussions with consultants, evaluation of patient's response to treatment, examination of patient, obtaining history from patient or surrogate, ordering and performing treatments and interventions, ordering and review of laboratory studies, ordering and review of radiographic studies, pulse oximetry and re-evaluation of patient's condition.  Glori Bickers, MD  5:02 PM

## 2020-09-04 NOTE — Progress Notes (Signed)
Pharmacy Antibiotic Note  John Fields is a 87 y.o. male admitted on 08/29/2020 with ACS and VFib arrest, now hypotensive requiring pressors. Pharmacy has been consulted for cefepime dosing with concern for sepsis. WBC up, Cr rising.  Plan: Cefepime 2g IV q24h Follow cultures, LOT, renal function  Height: 5\' 7"  (170.2 cm) Weight: 54.6 kg (120 lb 5.9 oz) IBW/kg (Calculated) : 66.1  Temp (24hrs), Avg:98 F (36.7 C), Min:97.8 F (36.6 C), Max:98.2 F (36.8 C)  Recent Labs  Lab 08/31/20 0145 09/01/20 0128 09/01/20 0756 09/02/20 0344 09/03/20 0226 09/04/20 0150 09/04/20 1503 09/04/20 1531  WBC 9.3 6.6  --  10.0 8.0 9.7 13.8*  --   CREATININE 1.24  --  1.22 1.23 1.27*  --  1.62*  --   LATICACIDVEN  --   --   --   --   --   --   --  2.8*    Estimated Creatinine Clearance: 24.3 mL/min (A) (by C-G formula based on SCr of 1.62 mg/dL (H)).    Allergies  Allergen Reactions  . Requip [Ropinirole] Other (See Comments)    insomnia  . Statins Diarrhea    Antimicrobials this admission: Cefepime 1/3 >>  Microbiology results: sent  Thank you for allowing pharmacy to be a part of this patient's care.  11/02/20, PharmD, BCPS, [facility] Clinical Pharmacist [phone] Please check AMION for all [facility] Pharmacy numbers 09/04/2020

## 2020-09-04 NOTE — Significant Event (Signed)
Rapid Response Event Note   Reason for Call :  Anxiety, SBP 65 Pt up to chair when he became anxious. Upon VS check pt as also noted to be hypotensive and hypoxic at 86%. SpO2 increased from [facility] to [facility].   Initial Focused Assessment:  Pt noted to have a congested "wet" cough. He denies pain, endorses chest discomfort with coughing. Color is pale, skin is warm, dry. Lung sounds are clear, diminished. Breathing is mildly labored and tachypneic. He is alert & oriented.   VS: BP 65/49, HR 88, RR 26, SpO2 97% on 5LNC  Plan of Care:  -Transfer to ICU for management of Levophed gtt  Event Summary:  MD Notified: Dr. Royann Shivers Call Time: 1350 Arrival Time: 1400 End Time: 1450  Jennye Moccasin, RN

## 2020-09-04 NOTE — Progress Notes (Signed)
Patient complaining of anxiety and requested ativan - pressures in 70s on assessment and very anxious. Callie, cards PA as well as rapid response paged. Both immediately came to bedside to assess patient. Dr. Salena Saner also arrived to room. Levo started per order with rapid response RN, Mitzi Davenport and cards at bedside. ICU RN unable to take report, bedside report to be given. Patient transferred to 2H14 with primary RN, Rapid Response and cardiology.

## 2020-09-05 DIAGNOSIS — I739 Peripheral vascular disease, unspecified: Secondary | ICD-10-CM | POA: Diagnosis not present

## 2020-09-05 DIAGNOSIS — E43 Unspecified severe protein-calorie malnutrition: Secondary | ICD-10-CM | POA: Diagnosis not present

## 2020-09-05 DIAGNOSIS — I255 Ischemic cardiomyopathy: Secondary | ICD-10-CM | POA: Diagnosis not present

## 2020-09-05 DIAGNOSIS — I5021 Acute systolic (congestive) heart failure: Secondary | ICD-10-CM | POA: Diagnosis not present

## 2020-09-05 DIAGNOSIS — J441 Chronic obstructive pulmonary disease with (acute) exacerbation: Secondary | ICD-10-CM | POA: Diagnosis not present

## 2020-09-05 DIAGNOSIS — I2102 ST elevation (STEMI) myocardial infarction involving left anterior descending coronary artery: Secondary | ICD-10-CM | POA: Diagnosis not present

## 2020-09-05 DIAGNOSIS — J9601 Acute respiratory failure with hypoxia: Secondary | ICD-10-CM | POA: Diagnosis not present

## 2020-09-05 DIAGNOSIS — I213 ST elevation (STEMI) myocardial infarction of unspecified site: Secondary | ICD-10-CM | POA: Diagnosis not present

## 2020-09-05 LAB — BASIC METABOLIC PANEL
Anion gap: 8 (ref 5–15)
BUN: 67 mg/dL — ABNORMAL HIGH (ref 8–23)
CO2: 26 mmol/L (ref 22–32)
Calcium: 8.6 mg/dL — ABNORMAL LOW (ref 8.9–10.3)
Chloride: 104 mmol/L (ref 98–111)
Creatinine, Ser: 1.61 mg/dL — ABNORMAL HIGH (ref 0.61–1.24)
GFR, Estimated: 41 mL/min — ABNORMAL LOW (ref >60)
Glucose, Bld: 104 mg/dL — ABNORMAL HIGH (ref 70–99)
Potassium: 4.4 mmol/L (ref 3.5–5.1)
Sodium: 138 mmol/L (ref 135–145)

## 2020-09-05 LAB — CBC
HCT: 31 % — ABNORMAL LOW (ref 39.0–52.0)
Hemoglobin: 9.9 g/dL — ABNORMAL LOW (ref 13.0–17.0)
MCH: 30 pg (ref 26.0–34.0)
MCHC: 31.9 g/dL (ref 30.0–36.0)
MCV: 93.9 fL (ref 80.0–100.0)
Platelets: 168 10*3/uL (ref 150–400)
RBC: 3.3 MIL/uL — ABNORMAL LOW (ref 4.22–5.81)
RDW: 14.1 % (ref 11.5–15.5)
WBC: 10.7 10*3/uL — ABNORMAL HIGH (ref 4.0–10.5)
nRBC: 0 % (ref 0.0–0.2)

## 2020-09-05 LAB — COOXEMETRY PANEL
Carboxyhemoglobin: 1.2 % (ref 0.5–1.5)
Carboxyhemoglobin: 1.4 % (ref 0.5–1.5)
Methemoglobin: 0.7 % (ref 0.0–1.5)
Methemoglobin: 0.9 % (ref 0.0–1.5)
O2 Saturation: 46 %
O2 Saturation: 51.1 %
Total hemoglobin: 10.6 g/dL — ABNORMAL LOW (ref 12.0–16.0)
Total hemoglobin: 10 g/dL — ABNORMAL LOW (ref 12.0–16.0)

## 2020-09-05 LAB — GLUCOSE, CAPILLARY
Glucose-Capillary: 103 mg/dL — ABNORMAL HIGH (ref 70–99)
Glucose-Capillary: 113 mg/dL — ABNORMAL HIGH (ref 70–99)
Glucose-Capillary: 129 mg/dL — ABNORMAL HIGH (ref 70–99)
Glucose-Capillary: 132 mg/dL — ABNORMAL HIGH (ref 70–99)
Glucose-Capillary: 142 mg/dL — ABNORMAL HIGH (ref 70–99)
Glucose-Capillary: 93 mg/dL (ref 70–99)
Glucose-Capillary: 97 mg/dL (ref 70–99)

## 2020-09-05 LAB — LACTIC ACID, PLASMA: Lactic Acid, Venous: 1.3 mmol/L (ref 0.5–1.9)

## 2020-09-05 NOTE — Progress Notes (Signed)
Physical Therapy Treatment Patient Details Name: John Fields MRN: [id number] DOB: Jul 10, 1930 Today's Date: 09/05/2020    History of Present Illness This 87 y.o. male admitted with late presentation anterior MI.  He suffered V-Fib arrest in the ED requiring transient CPR and defibrillation.  cardiac cath revealed occluded LAD and 70% Rt coronary artery underwent revascularizaion.   PMH includes:  PVD, s/p AAA repair, COPD    PT Comments    Pt admitted with above diagnosis. Pt was able to ambulate with Harmon Pier walker in room but continues to fatigue quickly needing incr assist.  Pt will need SNF for incr Rehab. Pt currently with functional limitations due to balance and endurance deficits. Pt will benefit from skilled PT to increase their independence and safety with mobility to allow discharge to the venue listed below.     Follow Up Recommendations  SNF;Supervision/Assistance - 24 hour     Equipment Recommendations  Rolling walker with 5" wheels;3in1 (PT)    Recommendations for Other Services OT consult     Precautions / Restrictions Precautions Precautions: Fall Restrictions Weight Bearing Restrictions: No    Mobility  Bed Mobility Overal bed mobility: Needs Assistance Bed Mobility: Sit to Supine       Sit to supine: Supervision   General bed mobility comments: supervision for lines and safety  Transfers Overall transfer level: Needs assistance Equipment used:  Harmon Pier walker) Transfers: Sit to/from Stand Sit to Stand: Min guard         General transfer comment: min guard A for safety and Chief Technology Officer  Ambulation/Gait Ambulation/Gait assistance: Min assist;Mod assist Gait Distance (Feet): 50 Feet Assistive device:  (Eva walker) Gait Pattern/deviations: Step-through pattern;Decreased stride length;Trunk flexed;Wide base of support;Drifts right/left   Gait velocity interpretation: <1.8 ft/sec, indicate of risk for recurrent falls General Gait Details: cues  for posture and breathing technique. Pt on 3L for gait with abrupt fatigue and need to go walk to door and back to bed with pt needing mod assist by the time he backed to bed due to fatigue. HR to 120 bpm and SpO2 not reading until back in bed on 3L with O2  reading at 91% once it picked up with pt at rest in bed. Needed a lot of assist for [facility] walker management as well.   Stairs             Wheelchair Mobility    Modified Rankin (Stroke Patients Only)       Balance Overall balance assessment: Needs assistance Sitting-balance support: No upper extremity supported;Feet unsupported Sitting balance-Leahy Scale: Fair Sitting balance - Comments: able to sit EOb without UE support   Standing balance support: During functional activity;Bilateral upper extremity supported Standing balance-Leahy Scale: Poor Standing balance comment: reliant on UE support                            Cognition Arousal/Alertness: Awake/alert Behavior During Therapy: WFL for tasks assessed/performed Overall Cognitive Status: No family/caregiver present to determine baseline cognitive functioning Area of Impairment: Safety/judgement                         Safety/Judgement: Decreased awareness of safety;Decreased awareness of deficits     General Comments: Pt is a bit slow to process info, and demonstrates decreased safety awareness      Exercises General Exercises - Lower Extremity Long Arc Quad: AROM;Both;Seated;5 reps Hip Flexion/Marching: AROM;Both;Seated;5 reps  General Comments        Pertinent Vitals/Pain Pain Assessment: No/denies pain    Home Living                      Prior Function            PT Goals (current goals can now be found in the care plan section) Acute Rehab PT Goals Patient Stated Goal: to breathe better Progress towards PT goals: Progressing toward goals    Frequency    Min 3X/week      PT Plan Current plan remains  appropriate    Co-evaluation              AM-PAC PT "6 Clicks" Mobility   Outcome Measure  Help needed turning from your back to your side while in a flat bed without using bedrails?: A Little Help needed moving from lying on your back to sitting on the side of a flat bed without using bedrails?: A Little Help needed moving to and from a bed to a chair (including a wheelchair)?: A Little Help needed standing up from a chair using your arms (e.g., wheelchair or bedside chair)?: A Little Help needed to walk in hospital room?: A Lot Help needed climbing 3-5 steps with a railing? : A Lot 6 Click Score: 16    End of Session Equipment Utilized During Treatment: Gait belt;Oxygen Activity Tolerance: Patient limited by fatigue Patient left: with call bell/phone within reach;in bed;with bed alarm set Nurse Communication: Mobility status PT Visit Diagnosis: Other abnormalities of gait and mobility (R26.89);Difficulty in walking, not elsewhere classified (R26.2);Muscle weakness (generalized) (M62.81)     Time: 8921-1941 PT Time Calculation (min) (ACUTE ONLY): 26 min  Charges:  $Gait Training: 8-22 mins $Therapeutic Exercise: 8-22 mins                     John Fields W,PT Acute Rehabilitation Services Pager:  [phone]  Office:  [phone]     Berline Lopes 09/05/2020, 12:33 PM

## 2020-09-05 NOTE — Plan of Care (Signed)
  Problem: Activity: Goal: Ability to return to baseline activity level will improve Outcome: Progressing   

## 2020-09-05 NOTE — Evaluation (Addendum)
Clinical/Bedside Swallow Evaluation Patient Details  Name: John Fields MRN: [id number] Date of Birth: 1930-07-11  Today's Date: 09/05/2020 Time: SLP Start Time (ACUTE ONLY): [id number] SLP Stop Time (ACUTE ONLY): 1329 SLP Time Calculation (min) (ACUTE ONLY): 25 min  Past Medical History:  Past Medical History:  Diagnosis Date  . Abdominal aortic aneurysm (AAA) ([place])   . Anxiety   . Carotid artery disease ([place])   . COPD (chronic obstructive pulmonary disease) ([place])   . Hyperlipidemia   . Hypertension   . Peripheral vascular disease ([place])   . Prostate cancer [facility])    Past Surgical History:  Past Surgical History:  Procedure Laterality Date  . ABDOMINAL AORTIC ANEURYSM REPAIR    . BACK SURGERY    . CAROTID ENDARTERECTOMY    . Cataract surgery    . CORONARY STENT INTERVENTION N/A 08/29/2020   Procedure: CORONARY STENT INTERVENTION;  Surgeon: Belva Crome, MD;  Location: [place] [facility];  Service: Cardiovascular;  Laterality: N/A;  . CORONARY/GRAFT ACUTE MI REVASCULARIZATION N/A 08/29/2020   Procedure: Coronary/Graft Acute MI Revascularization;  Surgeon: Belva Crome, MD;  Location: [place] [facility];  Service: Cardiovascular;  Laterality: N/A;  . LEFT HEART CATH AND CORONARY ANGIOGRAPHY N/A 08/29/2020   Procedure: LEFT HEART CATH AND CORONARY ANGIOGRAPHY;  Surgeon: Belva Crome, MD;  Location: [place] [facility];  Service: Cardiovascular;  Laterality: N/A;   HPI:  Pt is an 87 yo male admitted with late presentation anterior MI. He suffered V-Fib arrest in the ED requiring transient CPR and defibrillation. Cardiac cath revealed occluded LAD and 70% Rt coronary artery s/p revascularizaion. PCCM involved for persistent dyspnea with concern for recurrent aspiration. CXR 1/3 revealed increased opacity in the RLL. PMH includes:  PVD, s/p AAA repair, COPD, reported difficulty swallowing since CEA approximately 5-6 years ago   Assessment / Plan / Recommendation Clinical Impression  Pt  describes a h/o chronic dysphagia s/p CEA ~5-6 years ago at another hospital, after which he also had to have revision surgery. His symptoms sound like they could be more esophageal, including dificulties with solids not "going down" and intermittently needing to regurgitate. He denies any h/o PNA since his dysphagia began. Given the above and post-op onset, question if he could have had changes to CN X impacting UES function; however, CN exam is unremarkable and his voice is not hoarse. During PO trials he had coughed once after the inital sip of thin liquids but had no other s/s of dysphagia until after all PO trials had stopped, at which time he began to have delayed coughing, gagging, and regurgitation of what appeared to be mucus only. For today, recommend adjusting diet to Dys 2 (finely chopped) with thin liquids with use of aspiration and esophageal precautions, but he will likely benefit from [facility] and/or more dedicated esophageal study while he is an inpatient. Discussed with PCCM and will plan for both on next date. SLP will f/u for readiness.  SLP Visit Diagnosis: Dysphagia, unspecified (R13.10)    Aspiration Risk  Mild aspiration risk;Moderate aspiration risk    Diet Recommendation Dysphagia 2 (Fine chop);Thin liquid   Liquid Administration via: Cup;Straw Medication Administration: Whole meds with puree (crush if larger) Supervision: Patient able to self feed;Intermittent supervision to cue for compensatory strategies Compensations: Slow rate;Small sips/bites;Follow solids with liquid (eat smaller, more frequent meals) Postural Changes: Seated upright at 90 degrees;Remain upright for at least 30 minutes after po intake    Other  Recommendations Recommended Consults:  Consider esophageal assessment Oral Care Recommendations: Oral care BID   Follow up Recommendations  (tba)      Frequency and Duration min 2x/week  2 weeks       Prognosis Prognosis for Safe Diet Advancement:  Good Barriers to Reach Goals: Time post onset      Swallow Study   General HPI: Pt is an 87 yo male admitted with late presentation anterior MI. He suffered V-Fib arrest in the ED requiring transient CPR and defibrillation. Cardiac cath revealed occluded LAD and 70% Rt coronary artery s/p revascularizaion. PCCM involved for persistent dyspnea with concern for recurrent aspiration. CXR 1/3 revealed increased opacity in the RLL. PMH includes:  PVD, s/p AAA repair, COPD, reported difficulty swallowing since CEA approximately 5-6 years ago Type of Study: Bedside Swallow Evaluation Previous Swallow Assessment: none in chart Diet Prior to this Study: Regular;Thin liquids Temperature Spikes Noted: No Respiratory Status: Nasal cannula History of Recent Intubation: No Behavior/Cognition: Alert;Cooperative;Pleasant mood Oral Cavity Assessment: Within Functional Limits Oral Care Completed by SLP: No Oral Cavity - Dentition: Adequate natural dentition;Dentures, top Vision: Functional for self-feeding Self-Feeding Abilities: Able to feed self Patient Positioning: Upright in bed Baseline Vocal Quality: Normal Volitional Cough: Weak;Congested Volitional Swallow: Able to elicit    Oral/Motor/Sensory Function Overall Oral Motor/Sensory Function: Within functional limits   Ice Chips Ice chips: Not tested   Thin Liquid Thin Liquid: Impaired Presentation: Cup;Self Fed;Straw Pharyngeal  Phase Impairments: Cough - Immediate;Cough - Delayed    Nectar Thick Nectar Thick Liquid: Not tested   Honey Thick Honey Thick Liquid: Not tested   Puree Puree: Impaired Presentation: Self Fed;Spoon Pharyngeal Phase Impairments: Cough - Delayed   Solid     Solid: Impaired Presentation: Self Fed Pharyngeal Phase Impairments: Cough - Delayed      Mahala Menghini., M.A. CCC-SLP Acute Rehabilitation Services Pager [phone] Office [phone]  09/05/2020,1:54 PM

## 2020-09-05 NOTE — Progress Notes (Signed)
Progress Note  Patient Name: John Fields Date of Encounter: 09/05/2020  [facility] Cardiologist: Kirk Ruths, MD   Subjective   Feeling better.  No longer feeling anxious.  Breathing is now calmer.  No wheezing. Chest still feels a little tight when he coughs, but this has improved.  Inpatient Medications    Scheduled Meds: . aspirin EC  81 mg Oral Daily  . busPIRone  15 mg Oral Daily  . Chlorhexidine Gluconate Cloth  6 each Topical Daily  . ezetimibe  10 mg Oral Daily  . feeding supplement  237 mL Oral BID BM  . guaiFENesin-dextromethorphan  5 mL Oral QID  . heparin injection (subcutaneous)  5,000 Units Subcutaneous Q8H  . sodium chloride flush  10-40 mL Intracatheter Q12H  . sodium chloride flush  3 mL Intravenous Q12H  . ticagrelor  90 mg Oral BID  . umeclidinium bromide  1 puff Inhalation Daily   Continuous Infusions: . sodium chloride Stopped (08/30/20 0817)  . sodium chloride    . sodium chloride 10 mL/hr at 09/05/20 0300  . ceFEPime (MAXIPIME) IV Stopped (09/04/20 1929)  . norepinephrine (LEVOPHED) Adult infusion 4 mcg/min (09/04/20 1901)   PRN Meds: sodium chloride, acetaminophen, albuterol, clonazePAM, loperamide, nitroGLYCERIN, ondansetron (ZOFRAN) IV, oxyCODONE, sodium chloride flush, sodium chloride flush   Vital Signs    Vitals:   09/05/20 0500 09/05/20 0600 09/05/20 0756 09/05/20 0805  BP: (!) 133/56 (!) 109/45    Pulse: 76 74    Resp: (!) 22 20 (!) 21   Temp:    97.9 F (36.6 C)  TempSrc:    Axillary  SpO2: 98% 97%    Weight:  52.8 kg    Height:        Intake/Output Summary (Last 24 hours) at 09/05/2020 0906 Last data filed at 09/05/2020 0600 Gross per 24 hour  Intake 535.11 ml  Output 1080 ml  Net -544.89 ml   Last 3 Weights 09/05/2020 09/04/2020 09/03/2020  Weight (lbs) 116 lb 6.5 oz 120 lb 5.9 oz 115 lb 15.4 oz  Weight (kg) 52.8 kg 54.6 kg 52.6 kg      Telemetry    Sinus rhythm with occasional PVCs and occasional PACs- Personally  Reviewed  ECG    Sinus rhythm with persistent ST elevation and Q waves across the anterior precordium- Personally Reviewed  Physical Exam  Elderly and frail GEN: No acute distress.   Neck: No JVD Cardiac: RRR, no murmurs, rubs, or gallops.  Respiratory:  Only hear a couple of rhonchi, no wheezing, otherwise clear to auscultation. GI: Soft, nontender, non-distended  MS: No edema; No deformity. Neuro:  Nonfocal  Psych: Normal affect   Labs    High Sensitivity Troponin:   Recent Labs  Lab 08/29/20 1043 08/29/20 1415 08/29/20 1634 08/29/20 1835  TROPONINIHS 12,408* >27,000* >27,000* >27,000*      Chemistry Recent Labs  Lab 08/29/20 1043 08/29/20 1200 09/03/20 0226 09/04/20 1503 09/05/20 0511  NA 141   < > 140 137 138  K 3.8   < > 4.5 4.2 4.4  CL 106   < > 106 99 104  CO2 23   < > 25 25 26   GLUCOSE 113*   < > 110* 137* 104*  BUN 20   < > 59* 66* 67*  CREATININE 1.10   < > 1.27* 1.62* 1.61*  CALCIUM 8.9   < > 8.9 9.2 8.6*  PROT 6.2*  --   --   --   --  ALBUMIN 3.3*  --   --   --   --   AST 91*  --   --   --   --   ALT 23  --   --   --   --   ALKPHOS 56  --   --   --   --   BILITOT 0.8  --   --   --   --   GFRNONAA >60   < > 54* 41* 41*  ANIONGAP 12   < > 9 13 8    < > = values in this interval not displayed.     Hematology Recent Labs  Lab 09/04/20 0150 09/04/20 1503 09/05/20 0511  WBC 9.7 13.8* 10.7*  RBC 3.28* 4.05* 3.30*  HGB 9.9* 12.0* 9.9*  HCT 30.8* 38.2* 31.0*  MCV 93.9 94.3 93.9  MCH 30.2 29.6 30.0  MCHC 32.1 31.4 31.9  RDW 14.1 14.3 14.1  PLT 157 251 168    BNP Recent Labs  Lab 08/30/20 0520  BNP 1,345.1*     DDimer No results for input(s): DDIMER in the last 168 hours.   Radiology    DG CHEST PORT 1 VIEW  Result Date: 09/04/2020 CLINICAL DATA:  Hypoxia.  PICC placement. EXAM: PORTABLE CHEST 1 VIEW COMPARISON:  08/30/2020 FINDINGS: New right PICC has its tip in the mid superior vena cava. Irregular interstitial opacities are  bilaterally most prominently in the lung bases, right greater than left. There additional confluent opacity at the right lung base obscuring the lateral hemidiaphragm suggesting a small pleural effusion new or increased from the previous exam. No pneumothorax. Cardiac silhouette is normal in size.  No mediastinal masses. IMPRESSION: 1. New right PICC has its tip in the mid superior vena cava. 2. Increase opacity at the right lung base suggesting a new or increased pleural effusion. Persistent bilateral interstitial lung opacities, greater in the bases, right greater than left. This may be all chronic. A component of interstitial edema or infection is possible. Electronically Signed   By: 09/01/2020 M.D.   On: 09/04/2020 17:17   ECHOCARDIOGRAM LIMITED  Addendum Date: 09/04/2020   There is hypokinesis of the apical anterior, apical septum and apex.  LVEF 50-55%.  Consider repeating limited images with Definity contrast for improved endocardial border definition and LVEF assessment. 11/02/2020 Electronically Amended 09/04/2020, 3:40 PM   Final (Amended)    Result Date: 09/04/2020    ECHOCARDIOGRAM LIMITED REPORT   Patient Name:   John Fields Date of Exam: 09/04/2020 Medical Rec #:  11/02/2020   Height:       67.0 in Accession #:    [id number]  Weight:       120.4 lb Date of Birth:  1930/02/05  BSA:          1.630 m Patient Age:    87 years    BP:           109/75 mmHg Patient Gender: M           HR:           86 bpm. Exam Location:  Inpatient Procedure: Limited Echo, Color Doppler and Cardiac Doppler Indications:    I31.3 Pericardial effusion  History:        Patient has prior history of Echocardiogram examinations, most                 recent 08/29/2020. CAD, COPD; Risk Factors:Hypertension and  Dyslipidemia.  Sonographer:    Raquel Sarna Senior RDCS Referring Phys: [phone] Senaya Dicenso  Sonographer Comments: Technically difficult study due to COPD. IMPRESSIONS  1. Left ventricular ejection fraction,  by estimation, is 60 to 65%. The left ventricle has normal function. The left ventricle has no regional wall motion abnormalities.  2. Right ventricular systolic function is normal. The right ventricular size is normal.  3. The mitral valve is normal in structure. No evidence of mitral valve regurgitation. No evidence of mitral stenosis.  4. The aortic valve is calcified. There is mild calcification of the aortic valve. There is mild thickening of the aortic valve. Aortic valve regurgitation is not visualized. No aortic stenosis is present.  5. The inferior vena cava is normal in size with greater than 50% respiratory variability, suggesting right atrial pressure of 3 mmHg. FINDINGS  Left Ventricle: Left ventricular ejection fraction, by estimation, is 60 to 65%. The left ventricle has normal function. The left ventricle has no regional wall motion abnormalities. The left ventricular internal cavity size was normal in size. There is  no left ventricular hypertrophy. Left ventricular diastolic function could not be evaluated due to nondiagnostic images. Right Ventricle: The right ventricular size is normal. No increase in right ventricular wall thickness. Right ventricular systolic function is normal. Left Atrium: Left atrial size was normal in size. Right Atrium: Right atrial size was normal in size. Pericardium: There is no evidence of pericardial effusion. Mitral Valve: The mitral valve is normal in structure. Mild mitral annular calcification. No evidence of mitral valve stenosis. Tricuspid Valve: The tricuspid valve is not well visualized. Aortic Valve: The aortic valve is calcified. There is mild calcification of the aortic valve. There is mild thickening of the aortic valve. Aortic valve regurgitation is not visualized. No aortic stenosis is present. Pulmonic Valve: The pulmonic valve was not assessed. Aorta: The aortic root is normal in size and structure. Venous: The inferior vena cava is normal in size with  greater than 50% respiratory variability, suggesting right atrial pressure of 3 mmHg. IAS/Shunts: No atrial level shunt detected by color flow Doppler. Eleonore Chiquito MD Electronically signed by Eleonore Chiquito MD Signature Date/Time: 09/04/2020/2:25:11 PM    Final Loreli Dollar)    [place] EKG SITE RITE  Result Date: 09/04/2020 If Site Rite image not attached, placement could not be confirmed due to current cardiac rhythm.   Cardiac Studies   On my review, the 09/04/2020 echo report overestimates true LVEF.  The apex is not well imaged and then a single view from the subcostal is dyskinetic.  Suspect EF is closer to 40% (see image 15).  Unfortunately, there is no evidence of pericardial effusion to suggest pseudoaneurysm/perforation   Cardiac Catheterization 08/29/2020:  A stent was successfully placed.   Late presenting (greater than 30 hours) anteroapical infarction complicated by ventricular fibrillation in the emergency department after the initial evaluation.  Successful PCI of proximal to mid total occlusion of the LAD reducing the stenosis to 0% with TIMI grade III flow. There is eccentric 50% stenosis proximal to the stent but without evidence of dissection noted. The large 1st diagonal was not jailed.  Patent left main  Relatively small but patent circumflex.  Very tortuous right coronary supplying collaterals to the mid and apical LAD via septal perforators. The mid RCA contains a 70 to 80% stenosis beyond region of significant tortuosity.  Anteroapical akinesis. LVEDP 9 mmHg. Acute systolic heart failure with normal filling pressure.  Recommendations:  Because of the late presenting  nature of the patient's infarction, IV heparin will be resumed.  2D Doppler echocardiogram will be done in a.m. to assess for the presence of apical thrombus. If none is present heparin can be discontinued.  Continue dual antiplatelet therapy.  Optimize medical therapy to achieve guideline  directed therapy for systolic dysfunction  Aggressive risk factor modification including consideration of PCSK9 therapy for lipid control Diagnostic Dominance: Right    Intervention    _______________  Patient Profile     87 y.o. male with a history of carotid artery disease, PVD with previous AAA repair followed at [facility], COPD, hypertension, hyperlipidemia, and prostate cancer who presented on 12/27/2021with chest pain and shortness of breath and was found to have an acute anterior myocardial infarction. Of note, while in the ED, patient suffered ventricular fibrillation cardiac arrest and was treated with Epinephrine 1mg  and was defibrillated. He received a total of 2 minutes of CPR with ROSC and then underwent PCI-LAD with placement of drug-eluting stent on 08/21/2020. Suffered acute decompensation on 09/04/2020 with severe hypotension/shock and respiratory distress, requiring norepinephrine infusion and transfer to ICU.  Assessment & Plan    1.  Hypotension/shock: Possibly a component of septic shock with sudden increase in WBC, consider pulmonary infection.  On antibiotics.  Component of cardiogenic shock following extensive infarction (as described above I think the most recent echo markedly overestimates his true LVEF) and definitely a component of hypovolemic shock/hypotension due to medications, which has improved.  Off Entresto and beta-blockers for now.  CVP 6.  Coox pending.  Renal function abnormal, but stable. 2.  Acute on chronic respiratory insufficiency with hypoxia: Underlying COPD, complicated by pulmonary edema following extensive anterior myocardial infarction, improved.  CVP 6.   3.  CAD s/p extensive anterior MI with delayed reperfusion: At high risk for recurrent heart failure exacerbation.  On dual antiplatelet therapy.  Reportedly intolerant to statins and started on ezetimibe.  As an outpatient he should probably be transitioned to PCSK9 inhibitors. 4. AKI:  Currently stable creatinine at 1.6/BUN 67; baseline creatinine less than 1.0.  Hopefully will not see further deterioration due to the transient period of hypotension.  Cautiously resume ARB at low-dose tomorrow if blood pressure and renal function allow.  He may not tolerate Entresto.  I also think it is premature to restart beta-blockers at this time. 5. VF arrest: During early post infarction.  Not a candidate for ICD.   For questions or updates, please contact [place] Please consult [email or web address] for contact info under        Signed, Sanda Klein, MD  09/05/2020, 9:06 AM

## 2020-09-05 NOTE — TOC Progression Note (Signed)
Transition of Care [facility]) - Progression Note    Patient Details  Name: Gerhardt Gleed MRN: [id number] Date of Birth: 10-Feb-1930  Transition of Care [facility]) CM/SW Contact  Terrial Rhodes, LCSWA Phone Number: 09/05/2020, 6:03 PM  Clinical Narrative:     CSW spoke with French Ana with [facility] who confirmed she can accept patient for SNF placement. CSW informed patients daughter Dondra Spry.  CSW will continue to follow.   Expected Discharge Plan: Skilled Nursing Facility Barriers to Discharge: Continued Medical Work up,SNF Pending bed offer,Insurance Authorization  Expected Discharge Plan and Services Expected Discharge Plan: Skilled Nursing Facility                                               Social Determinants of Health (SDOH) Interventions    Readmission Risk Interventions No flowsheet data found.

## 2020-09-05 NOTE — Progress Notes (Signed)
NAME:  Edmar Blankenburg, MRN:  [id number], DOB:  December 15, 1929, LOS: 7 ADMISSION DATE:  08/29/2020, CONSULTATION DATE:  08/29/2020 REFERRING MD:  Mendel Ryder - [facility], CHIEF COMPLAINT:  Hypoxia following cardiac arrest.    HPI/course in hospital  87 year old vasculopath presented with new onset RSCP without radiation or dyspnea, intermittently since 12/26 without clear relation to exertion.  Persistent chest pain since 0100. In ED, EKG showed anterior STEMI ( TIMI score 6). Suffered brief arrest in ED per cardiology (no documentation). Brought to cath lab for emergent revascularization.    Past Medical History  Review of recent medical history: AAA repaired EVAR 2010, chronic diarrhea, possibly statin related. PVD. COPD by history - not on home O2, no pulmonary follow-up, no recent PFTs.  Quit smoking several years ago.  Hospital course   12/31 critical care signed off, we had been seeing for shortness of breath in the setting of what was felt to be COPD exacerbation superimposed on underlying ST elevation acute coronary syndrome where he underwent PCI of the LAD 1/2 continuing to have mild  exertional dyspnea, actually having hypertension, losartan discontinued and placed on Entresto 1/3 still having minimal shortness of breath with activity had some chest tightness with cough, also endorsing occasionally coughing after eating typically mucus same color is what ever food he was eating, seen by heart failure team, repeat EF had improved from 35 to 40% up to 60 to 65% found to actually be hypotensive and because of this moved to the ICU for further evaluation 1/4: Critical care follow-up as back in the intensive care and antibiotic started, bedside ultrasound showing consolidation but no significant effusion    Interim history/subjective:  Still short of breath with minimal activity, fairly diminished on the right, no wheezing. Objective   Blood pressure (Abnormal) 109/45, pulse 74,  temperature (Abnormal) 97.4 F (36.3 C), resp. rate (Abnormal) 21, height 5\' 7"  (1.702 m), weight 52.8 kg, SpO2 95 %. CVP:  [1 mmHg-6 mmHg] 6 mmHg      Intake/Output Summary (Last 24 hours) at 09/05/2020 1145 Last data filed at 09/05/2020 0600 Gross per 24 hour  Intake 535.11 ml  Output 1080 ml  Net -544.89 ml   Filed Weights   09/03/20 0451 09/04/20 0454 09/05/20 0600  Weight: 52.6 kg 54.6 kg 52.8 kg    Examination:  Physical Exam General this is a elderly 87 year old white male who is resting in bed currently eating he is in no acute distress HEENT normocephalic atraumatic no jugular venous distention Pulmonary: Diminished right base, no accessory use currently.  Currently on nasal cannula Cardiac: Regular rate and rhythm Abdomen: Soft nontender Extremities: Warm dry with brisk capillary refill Neuro: Awake oriented no focal deficits. GU clear yellow     Assessment & Plan:      Acute hypoxic respiratory insufficiency Status post VF cardiac arrest likely ischemic in origin. Ischemic cardiomyopathy, follow-up echocardiogram as of 1/3 shows improved LV function Calcified coronary artery disease Peripheral vascular disease Status post EVAR repair of AAA Possible microscopic colitis. Dyspnea with exertion Acute exacerbation of COPD AKI  Current problem list: acute ST elevation anterior wall myocardial infarction Killip class I, status post drug-eluting stent;  complicated by hypotension which was likely volume depletion versus early sepsis  Responded to IV fluids Plan Continue to hold diuretics Holding beta-blockade Continue dual antiplatelet therapy Continue telemetry  Possible right lower lobe pneumonia.  With exertional dyspnea, superimposed on underlying COPD, status post recent treatment for exacerbation Portable  chest x-ray personally reviewed shows right lower lobe airspace disease Bedside ultrasound obtained/personally evaluated almost no pleural fluid.   Does show some consolidation. Leukocytosis improving Endorses a history of dysphagia and what sounds like possible esophageal dysfunction this is been an ongoing and chronic issue for at least 3 years Plan Continue supplemental oxygen He is currently on day number 2 of cefepime, will check pro calcitonin trend, and would continue antibiotics for at least 5 days When he stabilizes he needs an esophagram, will also request SLP evaluation, I am concerned about dysphagia or at least minimally esophageal dysfunction resulting in recurrent aspiration Continue scheduled bronchodilators He is completed steroids, I do not think we need to resume these   AKI Suspect exacerbated by volume depletion and hypotension Plan Hold diuretics Maximize cardiac output Follow-up a.m.  Best practice   Per primary   Erick Colace ACNP-BC [place] Pager # [phone] OR # [phone] if no answer

## 2020-09-05 NOTE — Progress Notes (Signed)
Advanced Heart Failure Rounding Note   Subjective:    Moved to ICU yesterday due to profound hypotension and lactic acidosis. Started on [place]. Initial co-ox 57% (on NE) with CVP <5. NE weaned off overnight.   Says he feels better. Less anxious. Still a little SOB. CVP 3. Repeat co-ox 46%  SBP 90-110  Objective:   Weight Range:  Vital Signs:   Temp:  [97.2 F (36.2 C)-98.2 F (36.8 C)] 97.9 F (36.6 C) (01/04 0805) Pulse Rate:  [74-95] 74 (01/04 0600) Resp:  [16-30] 21 (01/04 0756) BP: (65-194)/(45-96) 109/45 (01/04 0600) SpO2:  [90 %-99 %] 95 % (01/04 0918) Weight:  [52.8 kg] 52.8 kg (01/04 0600) Last BM Date: 09/04/20  Weight change: Filed Weights   09/03/20 0451 09/04/20 0454 09/05/20 0600  Weight: 52.6 kg 54.6 kg 52.8 kg    Intake/Output:   Intake/Output Summary (Last 24 hours) at 09/05/2020 1132 Last data filed at 09/05/2020 0600 Gross per 24 hour  Intake 535.11 ml  Output 1080 ml  Net -544.89 ml     Physical Exam: General:  Elderly male sitting up inc hair No resp difficulty HEENT: normal Neck: supple. JVP flat. Carotids 2+ bilat; no bruits. No lymphadenopathy or thryomegaly appreciated. Cor: PMI nondisplaced. Regular rate & rhythm. No rubs, gallops or murmurs. Lungs: clear Abdomen: soft, nontender, nondistended. No hepatosplenomegaly. No bruits or masses. Good bowel sounds. Extremities: no cyanosis, clubbing, rash, edema Neuro: alert & orientedx3, cranial nerves grossly intact. moves all 4 extremities w/o difficulty. Affect pleasant  Telemetry: Sinus 70s Personally reviewed  Labs: Basic Metabolic Panel: Recent Labs  Lab 09/01/20 0756 09/02/20 0344 09/03/20 0226 09/04/20 1503 09/05/20 0511  NA 142 140 140 137 138  K 3.6 4.1 4.5 4.2 4.4  CL 106 104 106 99 104  CO2 26 26 25 25 26   GLUCOSE 127* 118* 110* 137* 104*  BUN 45* 59* 59* 66* 67*  CREATININE 1.22 1.23 1.27* 1.62* 1.61*  CALCIUM 8.8* 8.8* 8.9 9.2 8.6*    Liver Function Tests: No  results for input(s): AST, ALT, ALKPHOS, BILITOT, PROT, ALBUMIN in the last 168 hours. No results for input(s): LIPASE, AMYLASE in the last 168 hours. No results for input(s): AMMONIA in the last 168 hours.  CBC: Recent Labs  Lab 09/02/20 0344 09/03/20 0226 09/04/20 0150 09/04/20 1503 09/05/20 0511  WBC 10.0 8.0 9.7 13.8* 10.7*  NEUTROABS  --   --   --  12.2*  --   HGB 8.3* 8.8* 9.9* 12.0* 9.9*  HCT 26.5* 26.6* 30.8* 38.2* 31.0*  MCV 95.0 94.7 93.9 94.3 93.9  PLT 126* 135* 157 251 168    Cardiac Enzymes: No results for input(s): CKTOTAL, CKMB, CKMBINDEX, TROPONINI in the last 168 hours.  BNP: BNP (last 3 results) Recent Labs    08/30/20 0520  BNP 1,345.1*    ProBNP (last 3 results) No results for input(s): PROBNP in the last 8760 hours.    Other results:  Imaging: DG CHEST PORT 1 VIEW  Result Date: 09/04/2020 CLINICAL DATA:  Hypoxia.  PICC placement. EXAM: PORTABLE CHEST 1 VIEW COMPARISON:  08/30/2020 FINDINGS: New right PICC has its tip in the mid superior vena cava. Irregular interstitial opacities are bilaterally most prominently in the lung bases, right greater than left. There additional confluent opacity at the right lung base obscuring the lateral hemidiaphragm suggesting a small pleural effusion new or increased from the previous exam. No pneumothorax. Cardiac silhouette is normal in size.  No mediastinal masses.  IMPRESSION: 1. New right PICC has its tip in the mid superior vena cava. 2. Increase opacity at the right lung base suggesting a new or increased pleural effusion. Persistent bilateral interstitial lung opacities, greater in the bases, right greater than left. This may be all chronic. A component of interstitial edema or infection is possible. Electronically Signed   By: Lajean Manes M.D.   On: 09/04/2020 17:17   ECHOCARDIOGRAM LIMITED  Addendum Date: 09/04/2020   There is hypokinesis of the apical anterior, apical septum and apex.  LVEF 50-55%.  Consider  repeating limited images with Definity contrast for improved endocardial border definition and LVEF assessment. Skeet Latch Electronically Amended 09/04/2020, 3:40 PM   Final (Amended)    Result Date: 09/04/2020    ECHOCARDIOGRAM LIMITED REPORT   Patient Name:   John Fields Date of Exam: 09/04/2020 Medical Rec #:  [id number]   Height:       67.0 in Accession #:    [id number]  Weight:       120.4 lb Date of Birth:  [birth year]  BSA:          1.630 m Patient Age:    87 years    BP:           109/75 mmHg Patient Gender: M           HR:           86 bpm. Exam Location:  Inpatient Procedure: Limited Echo, Color Doppler and Cardiac Doppler Indications:    I31.3 Pericardial effusion  History:        Patient has prior history of Echocardiogram examinations, most                 recent 08/29/2020. CAD, COPD; Risk Factors:Hypertension and                 Dyslipidemia.  Sonographer:    Raquel Sarna Senior RDCS Referring Phys: [id number] MIHAI CROITORU  Sonographer Comments: Technically difficult study due to COPD. IMPRESSIONS  1. Left ventricular ejection fraction, by estimation, is 60 to 65%. The left ventricle has normal function. The left ventricle has no regional wall motion abnormalities.  2. Right ventricular systolic function is normal. The right ventricular size is normal.  3. The mitral valve is normal in structure. No evidence of mitral valve regurgitation. No evidence of mitral stenosis.  4. The aortic valve is calcified. There is mild calcification of the aortic valve. There is mild thickening of the aortic valve. Aortic valve regurgitation is not visualized. No aortic stenosis is present.  5. The inferior vena cava is normal in size with greater than 50% respiratory variability, suggesting right atrial pressure of 3 mmHg. FINDINGS  Left Ventricle: Left ventricular ejection fraction, by estimation, is 60 to 65%. The left ventricle has normal function. The left ventricle has no regional wall motion abnormalities. The left  ventricular internal cavity size was normal in size. There is  no left ventricular hypertrophy. Left ventricular diastolic function could not be evaluated due to nondiagnostic images. Right Ventricle: The right ventricular size is normal. No increase in right ventricular wall thickness. Right ventricular systolic function is normal. Left Atrium: Left atrial size was normal in size. Right Atrium: Right atrial size was normal in size. Pericardium: There is no evidence of pericardial effusion. Mitral Valve: The mitral valve is normal in structure. Mild mitral annular calcification. No evidence of mitral valve stenosis. Tricuspid Valve: The tricuspid valve is not well visualized. Aortic Valve: The aortic  valve is calcified. There is mild calcification of the aortic valve. There is mild thickening of the aortic valve. Aortic valve regurgitation is not visualized. No aortic stenosis is present. Pulmonic Valve: The pulmonic valve was not assessed. Aorta: The aortic root is normal in size and structure. Venous: The inferior vena cava is normal in size with greater than 50% respiratory variability, suggesting right atrial pressure of 3 mmHg. IAS/Shunts: No atrial level shunt detected by color flow Doppler. Lennie Odor MD Electronically signed by Lennie Odor MD Signature Date/Time: 09/04/2020/2:25:11 PM    Final [facility])    [place] EKG SITE RITE  Result Date: 09/04/2020 If Site Rite image not attached, placement could not be confirmed due to current cardiac rhythm.     Medications:     Scheduled Medications: . aspirin EC  81 mg Oral Daily  . busPIRone  15 mg Oral Daily  . Chlorhexidine Gluconate Cloth  6 each Topical Daily  . ezetimibe  10 mg Oral Daily  . feeding supplement  237 mL Oral BID BM  . guaiFENesin-dextromethorphan  5 mL Oral QID  . heparin injection (subcutaneous)  5,000 Units Subcutaneous Q8H  . sodium chloride flush  10-40 mL Intracatheter Q12H  . sodium chloride flush  3 mL Intravenous Q12H   . ticagrelor  90 mg Oral BID  . umeclidinium bromide  1 puff Inhalation Daily     Infusions: . sodium chloride Stopped (08/30/20 0817)  . sodium chloride    . sodium chloride 10 mL/hr at 09/05/20 0300  . ceFEPime (MAXIPIME) IV Stopped (09/04/20 1929)  . norepinephrine (LEVOPHED) Adult infusion 4 mcg/min (09/04/20 1901)     PRN Medications:  sodium chloride, acetaminophen, albuterol, clonazePAM, loperamide, nitroGLYCERIN, ondansetron (ZOFRAN) IV, oxyCODONE, sodium chloride flush, sodium chloride flush   Assessment/Plan:   1. Hypotension - unclear etiology. ? Volume depletion/low output vs early sepsis.  - NE now off. BPs remains soft.  - Hold diuretics.  - continue empiric abx for 48 hour. cx pending - co-ox low. Will repeat  2. CAD with acute anterior STEMI  - hstrop > 27K - Cardiac cath showed proximal to mid total occlusion of LAD treated with DES. Eccentric 50% stenosis proximal to the stent but no evidence of dissection. Very tortuous RCA supplying collaterals to mid and apical LAD. 70-80% stenosis of mid RCA beyond region of significant tortuosity.  - no s/s angina - continue DAPT. Statin intolerant. On Zetia  - off b-blocker with low BP   3. Acute systolic HF due to iCM - Echo on presentation showed LVEF of 35-40% with grade IDD, and wall motion abnormalities. Repeat limited echo 1/3 EF 60-65%.   - EF much better than I would have suspected given cath results, slow troponin washout and persistent ST elevation of ECG. - co-ox low today 46%. Will repeat. Volume low. Off diuretics  4. AKI - Baseline Cr around 1, today Cr 1.61. suspect ATN  5. COPD - stable. Stop steroids  6. Anxiety - continue buspar and klonopin  7. DNR/DNI  Ok to transfer to progressive care. Will repeat co-ox. Hold diuretics.     Length of Stay: 7   Arvilla Meres MD 09/05/2020, 11:32 AM  Advanced Heart Failure Team Pager [phone] (M-F; 7a - 4p)  Please contact [facility] for  night-coverage after hours (4p -7a ) and weekends on [email or web address]

## 2020-09-06 ENCOUNTER — Inpatient Hospital Stay (HOSPITAL_COMMUNITY): Payer: Medicare Other

## 2020-09-06 DIAGNOSIS — I255 Ischemic cardiomyopathy: Secondary | ICD-10-CM | POA: Diagnosis not present

## 2020-09-06 DIAGNOSIS — I213 ST elevation (STEMI) myocardial infarction of unspecified site: Secondary | ICD-10-CM | POA: Diagnosis not present

## 2020-09-06 DIAGNOSIS — J9601 Acute respiratory failure with hypoxia: Secondary | ICD-10-CM | POA: Diagnosis not present

## 2020-09-06 DIAGNOSIS — J441 Chronic obstructive pulmonary disease with (acute) exacerbation: Secondary | ICD-10-CM | POA: Diagnosis not present

## 2020-09-06 LAB — CBC
HCT: 29.4 % — ABNORMAL LOW (ref 39.0–52.0)
Hemoglobin: 9.8 g/dL — ABNORMAL LOW (ref 13.0–17.0)
MCH: 31.1 pg (ref 26.0–34.0)
MCHC: 33.3 g/dL (ref 30.0–36.0)
MCV: 93.3 fL (ref 80.0–100.0)
Platelets: 182 10*3/uL (ref 150–400)
RBC: 3.15 MIL/uL — ABNORMAL LOW (ref 4.22–5.81)
RDW: 14.3 % (ref 11.5–15.5)
WBC: 10.1 10*3/uL (ref 4.0–10.5)
nRBC: 0 % (ref 0.0–0.2)

## 2020-09-06 LAB — BASIC METABOLIC PANEL
Anion gap: 10 (ref 5–15)
BUN: 69 mg/dL — ABNORMAL HIGH (ref 8–23)
CO2: 25 mmol/L (ref 22–32)
Calcium: 8.1 mg/dL — ABNORMAL LOW (ref 8.9–10.3)
Chloride: 101 mmol/L (ref 98–111)
Creatinine, Ser: 1.59 mg/dL — ABNORMAL HIGH (ref 0.61–1.24)
GFR, Estimated: 41 mL/min — ABNORMAL LOW (ref >60)
Glucose, Bld: 85 mg/dL (ref 70–99)
Potassium: 4.5 mmol/L (ref 3.5–5.1)
Sodium: 136 mmol/L (ref 135–145)

## 2020-09-06 LAB — GLUCOSE, CAPILLARY
Glucose-Capillary: 103 mg/dL — ABNORMAL HIGH (ref 70–99)
Glucose-Capillary: 108 mg/dL — ABNORMAL HIGH (ref 70–99)
Glucose-Capillary: 112 mg/dL — ABNORMAL HIGH (ref 70–99)
Glucose-Capillary: 150 mg/dL — ABNORMAL HIGH (ref 70–99)
Glucose-Capillary: 90 mg/dL (ref 70–99)
Glucose-Capillary: 93 mg/dL (ref 70–99)

## 2020-09-06 LAB — COOXEMETRY PANEL
Carboxyhemoglobin: 1.4 % (ref 0.5–1.5)
Methemoglobin: 0.6 % (ref 0.0–1.5)
O2 Saturation: 50.7 %
Total hemoglobin: 11.2 g/dL — ABNORMAL LOW (ref 12.0–16.0)

## 2020-09-06 MED ORDER — RESOURCE THICKENUP CLEAR PO POWD
ORAL | Status: DC | PRN
  Filled 2020-09-06 (×2): qty 125

## 2020-09-06 MED ORDER — LOSARTAN POTASSIUM 25 MG PO TABS
25.0000 mg | ORAL_TABLET | Freq: Every day | ORAL | Status: DC
  Administered 2020-09-06: 25 mg via ORAL
  Filled 2020-09-06: qty 1

## 2020-09-06 MED ORDER — DOCUSATE SODIUM 100 MG PO CAPS
100.0000 mg | ORAL_CAPSULE | Freq: Two times a day (BID) | ORAL | Status: DC | PRN
  Administered 2020-09-11: 100 mg via ORAL
  Filled 2020-09-06: qty 1

## 2020-09-06 NOTE — Progress Notes (Addendum)
Advanced Heart Failure Rounding Note   Subjective:    1/3: Tx to ICU for profound hypotension and lactic acidosis requiring NE.   Today reports main issue is breathing and coughing up mucus looks like food and liquid.  He denies chest pain.    Objective:   Weight Range:  Vital Signs:   Temp:  [97.4 F (36.3 C)-98.6 F (37 C)] 97.8 F (36.6 C) (01/05 0343) Pulse Rate:  [74-95] 95 (01/05 0634) Resp:  [13-25] 18 (01/05 0634) BP: (72-176)/(50-95) 128/71 (01/05 0634) SpO2:  [90 %-98 %] 93 % (01/05 0634) Weight:  [52.9 kg] 52.9 kg (01/05 0600) Last BM Date: 09/06/19  Weight change: Filed Weights   09/04/20 0454 09/05/20 0600 09/06/20 0600  Weight: 54.6 kg 52.8 kg 52.9 kg    Intake/Output:   Intake/Output Summary (Last 24 hours) at 09/06/2020 0704 Last data filed at 09/06/2020 0600 Gross per 24 hour  Intake 965.67 ml  Output 800 ml  Net 165.67 ml     Physical Exam: CVP Unable to obtain General:  Cachectic.  HEENT: normal Neck: supple. no JVD. Scar noted to right cervical area.  Cor: PMI nondisplaced. Regular rate & rhythm. No rubs, gallops or murmurs. Lungs: Mildly tachypneic, expiratory wheeze.  Abdomen: soft, nontender, nondistended.  Extremities: no cyanosis, clubbing, rash, edema Neuro: alert & oriented x 3, cranial nerves grossly intact. moves all 4 extremities w/o difficulty.    Telemetry: SR to ST, rates 90-100s.  Personally reviewed.   Labs: Basic Metabolic Panel: Recent Labs  Lab 09/02/20 0344 09/03/20 0226 09/04/20 1503 09/05/20 0511 09/06/20 0446  NA 140 140 137 138 136  K 4.1 4.5 4.2 4.4 4.5  CL 104 106 99 104 101  CO2 26 25 25 26 25   GLUCOSE 118* 110* 137* 104* 85  BUN 59* 59* 66* 67* 69*  CREATININE 1.23 1.27* 1.62* 1.61* 1.59*  CALCIUM 8.8* 8.9 9.2 8.6* 8.1*    Liver Function Tests: No results for input(s): AST, ALT, ALKPHOS, BILITOT, PROT, ALBUMIN in the last 168 hours. No results for input(s): LIPASE, AMYLASE in the last 168  hours. No results for input(s): AMMONIA in the last 168 hours.  CBC: Recent Labs  Lab 09/03/20 0226 09/04/20 0150 09/04/20 1503 09/05/20 0511 09/06/20 0446  WBC 8.0 9.7 13.8* 10.7* 10.1  NEUTROABS  --   --  12.2*  --   --   HGB 8.8* 9.9* 12.0* 9.9* 9.8*  HCT 26.6* 30.8* 38.2* 31.0* 29.4*  MCV 94.7 93.9 94.3 93.9 93.3  PLT 135* 157 251 168 182    Cardiac Enzymes: No results for input(s): CKTOTAL, CKMB, CKMBINDEX, TROPONINI in the last 168 hours.  BNP: BNP (last 3 results) Recent Labs    08/30/20 0520  BNP 1,345.1*    ProBNP (last 3 results) No results for input(s): PROBNP in the last 8760 hours.    Other results:  Imaging: DG CHEST PORT 1 VIEW  Result Date: 09/04/2020 CLINICAL DATA:  Hypoxia.  PICC placement. EXAM: PORTABLE CHEST 1 VIEW COMPARISON:  08/30/2020 FINDINGS: New right PICC has its tip in the mid superior vena cava. Irregular interstitial opacities are bilaterally most prominently in the lung bases, right greater than left. There additional confluent opacity at the right lung base obscuring the lateral hemidiaphragm suggesting a small pleural effusion new or increased from the previous exam. No pneumothorax. Cardiac silhouette is normal in size.  No mediastinal masses. IMPRESSION: 1. New right PICC has its tip in the mid superior vena  cava. 2. Increase opacity at the right lung base suggesting a new or increased pleural effusion. Persistent bilateral interstitial lung opacities, greater in the bases, right greater than left. This may be all chronic. A component of interstitial edema or infection is possible. Electronically Signed   By: Lajean Manes M.D.   On: 09/04/2020 17:17   ECHOCARDIOGRAM LIMITED  Addendum Date: 09/04/2020   There is hypokinesis of the apical anterior, apical septum and apex.  LVEF 50-55%.  Consider repeating limited images with Definity contrast for improved endocardial border definition and LVEF assessment. Skeet Latch Electronically  Amended 09/04/2020, 3:40 PM   Final (Amended)    Result Date: 09/04/2020    ECHOCARDIOGRAM LIMITED REPORT   Patient Name:   SAVANT VUOLO Date of Exam: 09/04/2020 Medical Rec #:  [id number]   Height:       67.0 in Accession #:    [id number]  Weight:       120.4 lb Date of Birth:  May 04, 1930  BSA:          1.630 m Patient Age:    87 years    BP:           109/75 mmHg Patient Gender: M           HR:           86 bpm. Exam Location:  Inpatient Procedure: Limited Echo, Color Doppler and Cardiac Doppler Indications:    I31.3 Pericardial effusion  History:        Patient has prior history of Echocardiogram examinations, most                 recent 08/29/2020. CAD, COPD; Risk Factors:Hypertension and                 Dyslipidemia.  Sonographer:    Raquel Sarna Senior RDCS Referring Phys: [id number] MIHAI CROITORU  Sonographer Comments: Technically difficult study due to COPD. IMPRESSIONS  1. Left ventricular ejection fraction, by estimation, is 60 to 65%. The left ventricle has normal function. The left ventricle has no regional wall motion abnormalities.  2. Right ventricular systolic function is normal. The right ventricular size is normal.  3. The mitral valve is normal in structure. No evidence of mitral valve regurgitation. No evidence of mitral stenosis.  4. The aortic valve is calcified. There is mild calcification of the aortic valve. There is mild thickening of the aortic valve. Aortic valve regurgitation is not visualized. No aortic stenosis is present.  5. The inferior vena cava is normal in size with greater than 50% respiratory variability, suggesting right atrial pressure of 3 mmHg. FINDINGS  Left Ventricle: Left ventricular ejection fraction, by estimation, is 60 to 65%. The left ventricle has normal function. The left ventricle has no regional wall motion abnormalities. The left ventricular internal cavity size was normal in size. There is  no left ventricular hypertrophy. Left ventricular diastolic function could not be  evaluated due to nondiagnostic images. Right Ventricle: The right ventricular size is normal. No increase in right ventricular wall thickness. Right ventricular systolic function is normal. Left Atrium: Left atrial size was normal in size. Right Atrium: Right atrial size was normal in size. Pericardium: There is no evidence of pericardial effusion. Mitral Valve: The mitral valve is normal in structure. Mild mitral annular calcification. No evidence of mitral valve stenosis. Tricuspid Valve: The tricuspid valve is not well visualized. Aortic Valve: The aortic valve is calcified. There is mild calcification of the aortic valve. There is  mild thickening of the aortic valve. Aortic valve regurgitation is not visualized. No aortic stenosis is present. Pulmonic Valve: The pulmonic valve was not assessed. Aorta: The aortic root is normal in size and structure. Venous: The inferior vena cava is normal in size with greater than 50% respiratory variability, suggesting right atrial pressure of 3 mmHg. IAS/Shunts: No atrial level shunt detected by color flow Doppler. Lennie Odor MD Electronically signed by Lennie Odor MD Signature Date/Time: 09/04/2020/2:25:11 PM    Final [facility])    [place] EKG SITE RITE  Result Date: 09/04/2020 If Site Rite image not attached, placement could not be confirmed due to current cardiac rhythm.    Medications:     Scheduled Medications: . aspirin EC  81 mg Oral Daily  . busPIRone  15 mg Oral Daily  . Chlorhexidine Gluconate Cloth  6 each Topical Daily  . ezetimibe  10 mg Oral Daily  . feeding supplement  237 mL Oral BID BM  . guaiFENesin-dextromethorphan  5 mL Oral QID  . heparin injection (subcutaneous)  5,000 Units Subcutaneous Q8H  . sodium chloride flush  10-40 mL Intracatheter Q12H  . sodium chloride flush  3 mL Intravenous Q12H  . ticagrelor  90 mg Oral BID  . umeclidinium bromide  1 puff Inhalation Daily    Infusions: . sodium chloride Stopped (08/30/20 0817)  .  sodium chloride    . sodium chloride 10 mL/hr at 09/06/20 0600  . ceFEPime (MAXIPIME) IV Stopped (09/05/20 1831)    PRN Medications: sodium chloride, acetaminophen, albuterol, clonazePAM, loperamide, nitroGLYCERIN, ondansetron (ZOFRAN) IV, oxyCODONE, sodium chloride flush, sodium chloride flush   Assessment/Plan:   1. Hypotension - unclear etiology. ? Volume depletion/low output vs early sepsis.  - NE now off. BPs remains labile.  - Co-ox 51%, CVP unable to obtain.   - Continue to hold diuretics.  - continue cefepime (Day #2) possible RLL PNA. CX NGTD  2. CAD with acute anterior STEMI  - hstrop > 27K - Cardiac cath showed proximal to mid total occlusion of LAD treated with DES. Eccentric 50% stenosis proximal to the stent but no evidence of dissection. Very tortuous RCA supplying collaterals to mid and apical LAD. 70-80% stenosis of mid RCA beyond region of significant tortuosity.  - no s/s angina - continue DAPT. Statin intolerant. On Zetia  - off b-blocker with labile BP   3. Acute systolic HF due to ICM - Echo on presentation showed LVEF of 35-40% with grade IDD, and wall motion abnormalities. Repeat limited echo 1/3 EF 60-65%.   - EF much better than I would have suspected given cath results, slow troponin washout and persistent ST elevation of ECG. - co-ox 51%. Volume low. Off diuretics - No spiro/ACEi in setting of AKI - Hold home lopressor due to labile BP  4. AKI - Baseline Cr around 1, today Cr 1.59. Peak Cr 1.62 suspect ATN  5. COPD - stable, steroids were stopped.  - Continue inhalers daily  6. Anxiety - continue buspar and klonopin  7. Dysphagia -hx of CEA 5-6 years ago, since has had problems swallowing -S&S: recommended D2 diet -Esophagram today  8. DNR/DNI  Ok to transfer to progressive care.    Length of Stay: 8  Terance Ice MD 09/06/2020, 7:04 AM  Advanced Heart Failure Team Pager [phone] (M-F; 7a - 4p)  Please contact [facility] for  night-coverage after hours (4p -7a ) and weekends on [email or web address]  Patient seen and examined with the above-signed Advanced Practice  Provider and/or Housestaff. I personally reviewed laboratory data, imaging studies and relevant notes. I independently examined the patient and formulated the important aspects of the plan. I have edited the note to reflect any of my changes or salient points. I have personally discussed the plan with the patient and/or family.  He feels very weak today. Says he feels bad but can't pinpoint why. Co-ox remains marginal at 51% CVP 3-4. No CP   General:  Elderly male sitting up in chair No resp difficulty HEENT: normal Neck: supple. no JVD. Carotids 2+ bilat; no bruits. No lymphadenopathy or thryomegaly appreciated. Cor: PMI nondisplaced. Regular rate & rhythm. No rubs, gallops or murmurs. Lungs: mild wheeze Abdomen: soft, nontender, nondistended. No hepatosplenomegaly. No bruits or masses. Good bowel sounds. Extremities: no cyanosis, clubbing, rash, edema Neuro: alert & orientedx3, cranial nerves grossly intact. moves all 4 extremities w/o difficulty. Affect pleasant  He continues to struggle. He feels weak and anxious. Co-ox marginal. Volume status ok. EF on echo is better than I expected and suspect functional EF likely lower. BP starting to stabilize.  Not sure there is much more to do for him at this juncture other than to give him more time. Will continue inhalers. Follow BP if SBP > 130s consistently can add low-dose losartan. No b-blocker just yet with low output and wheezing. Will have Pallaitive see him to help with anxiety and symptom management. He can go to PCU.   Glori Bickers, MD  10:39 AM

## 2020-09-06 NOTE — Progress Notes (Signed)
Modified Barium Swallow Progress Note  Patient Details  Name: John Fields MRN: [id number] Date of Birth: July 17, 1930  Today's Date: 09/06/2020  Modified Barium Swallow completed.  Full report located under Chart Review in the Imaging Section.  Brief recommendations include the following:  Clinical Impression  Pt presents with a pharyngeal and suspected esophageal dysphagia, with barium filling the esophagus upon completion of testing (esophagram to be completed after MBS). He has aspiration before and during the swallow with thin liquids and larger straw sips of nectar thick liquids due to a timing impairment and incomplete laryngeal closure. Aspiration is largely silent and his cough mechanics are poor with volitional attempts. When larger amounts of aspiration occurred he would have a delayed cough that sounded minimally stronger, but he could never clear his airway. Mild amounts of pharyngeal residue was noted with liquids more than solids, which may be more suggestive of esophageal component. Although oropharyngeal swallow was [facility] with solids, recommend continuing Dys 2 diet given concern for esophageal clearance. Would also crush medications and thicken liquids to nectar thick (no straws). Will f/u for dysphagia intervention to maximize safety.   Swallow Evaluation Recommendations       SLP Diet Recommendations: Dysphagia 2 (Fine chop) solids;Nectar thick liquid   Liquid Administration via: Cup;No straw   Medication Administration: Crushed with puree   Supervision: Patient able to self feed;Intermittent supervision to cue for compensatory strategies   Compensations: Slow rate;Small sips/bites;Follow solids with liquid (eat smaller, more frequent meals)   Postural Changes: Seated upright at 90 degrees;Remain semi-upright after after feeds/meals (Comment)   Oral Care Recommendations: Oral care BID        Mahala Menghini., M.A. CCC-SLP Acute Rehabilitation Services Pager  [phone] Office [phone]  09/06/2020,11:11 AM

## 2020-09-06 NOTE — Progress Notes (Signed)
Occupational Therapy Treatment Patient Details Name: John Fields MRN: [id number] DOB: 1930-07-28 Today's Date: 09/06/2020    History of present illness This 87 y.o. male admitted with late presentation anterior MI.  He suffered V-Fib arrest in the ED requiring transient CPR and defibrillation.  cardiac cath revealed occluded LAD and 70% Rt coronary artery underwent revascularizaion.   PMH includes:  PVD, s/p AAA repair, COPD   OT comments  Patient continues to progress towards his goals.  Skilled OT in the acute setting needed to maximize functional status due to the deficits listed below.  Patient feeling down this date, just completed his swallowing study and not sure how things are going.  Nursing aware and trying to encourage him.  He shows a lot of ability to progress and regain his independence.  SNF has been recommended.    Follow Up Recommendations  SNF    Equipment Recommendations  None recommended by OT    Recommendations for Other Services      Precautions / Restrictions Precautions Precautions: Fall Precaution Comments: Dys 2 diet with nectar thick liquids. Restrictions Weight Bearing Restrictions: No       Mobility Bed Mobility   Bed Mobility: Sit to Supine     Supine to sit: Supervision;HOB elevated        Transfers Overall transfer level: Needs assistance   Transfers: Sit to/from Stand;Stand Pivot Transfers Sit to Stand: Min assist Stand pivot transfers: Min assist            Balance Overall balance assessment: Needs assistance Sitting-balance support: No upper extremity supported;Feet supported Sitting balance-Leahy Scale: Fair     Standing balance support: During functional activity;Bilateral upper extremity supported Standing balance-Leahy Scale: Poor Standing balance comment: reliant on UE support                           ADL either performed or assessed with clinical judgement   ADL       Grooming: Wash/dry  hands;Wash/dry face;Oral care;Brushing hair;Set up;Sitting                               Functional mobility during ADLs: Minimal assistance;Rolling walker                                                                          Shoulder Exercises Shoulder Flexion: AAROM;Both;20 reps Shoulder Extension: AAROM;Both;20 reps Elbow Flexion: AROM;Both;10 reps Elbow Extension: AROM;Both;10 reps Wrist Flexion: AROM;Both;10 reps Wrist Extension: AROM;Both;10 reps Digit Composite Flexion: AROM;Both;15 reps Composite Extension: AROM;Both;15 reps                Pertinent Vitals/ Pain       Pain Assessment: No/denies pain Faces Pain Scale: No hurt                                                          Frequency  Min 2X/week        Progress Toward Goals  OT Goals(current goals can now be found in the care plan section)  Progress towards OT goals: Progressing toward goals  Acute Rehab OT Goals Patient Stated Goal: patient hoping to move better OT Goal Formulation: With patient Time For Goal Achievement: 09/16/20 Potential to Achieve Goals: Good  Plan Discharge plan remains appropriate    Co-evaluation                 AM-PAC OT "6 Clicks" Daily Activity     Outcome Measure   Help from another person eating meals?: None Help from another person taking care of personal grooming?: A Little Help from another person toileting, which includes using toliet, bedpan, or urinal?: A Lot Help from another person bathing (including washing, rinsing, drying)?: A Lot Help from another person to put on and taking off regular upper body clothing?: A Little Help from another person to put on and taking off regular lower body clothing?: A Lot 6 Click Score: 16    End of Session Equipment Utilized During Treatment: Rolling walker;Oxygen  OT Visit Diagnosis: Unsteadiness on feet (R26.81)   Activity Tolerance  Patient limited by fatigue   Patient Left in chair;with call bell/phone within reach;with nursing/sitter in room   Nurse Communication Other (comment) (patient delivered tray with no thickened liquids.)        Time: 0814-4818 OT Time Calculation (min): 29 min  Charges: OT General Charges $OT Visit: 1 Visit OT Treatments $Self Care/Home Management : 8-22 mins $Therapeutic Exercise: 8-22 mins  09/06/2020  Rich, OTR/L  Acute Rehabilitation Services  Office:  [phone]    Suzanna Obey 09/06/2020, 12:47 PM

## 2020-09-06 NOTE — Progress Notes (Signed)
Nutrition Follow Up  DOCUMENTATION CODES:   Severe malnutrition in context of chronic illness,Underweight  INTERVENTION:    Magic cup TID with meals, each supplement provides 290 kcal and 9 grams of protein  Hormel Shake BID between meals, each supplement provides 520 kcals and 22 grams of protein  NUTRITION DIAGNOSIS:   Severe Malnutrition related to chronic illness as evidenced by severe fat depletion,severe muscle depletion.  Ongoing  GOAL:   Patient will meet greater than or equal to 90% of their needs  Progressing   MONITOR:   PO intake,Supplement acceptance,Labs,Weight trends  REASON FOR ASSESSMENT:   Malnutrition Screening Tool    ASSESSMENT:   87 yo male admitted with acute STEMI. PMH includes AAA, CAD, COPD, HTN, HLD, PVD   12/28 PCI to LAD lesion  Noted difficulty with swallowing yesterday. Had MBS this am which shows aspiration of thin liquids. Diet downgraded to dysphagia 2 with nectar thick liquids. Appetite shows fluctuation between 25-100% for his last eight meals. RD to add nectar thick supplements to maximize kcal and protein.   Admission weight: 53.1 kg  Current weight: 52.9 kg   UOP: 800 ml x 24 hrs   Medications: reviewed  Labs: CBG 90-112  Diet Order:   Diet Order            DIET DYS 2 Room service appropriate? Yes; Fluid consistency: Nectar Thick  Diet effective now                 EDUCATION NEEDS:   Education needs have been addressed  Skin:  Skin Assessment: Reviewed RN Assessment  Last BM:  1/5  Height:   Ht Readings from Last 1 Encounters:  08/30/20 5\' 7"  (1.702 m)    Weight:   Wt Readings from Last 1 Encounters:  09/06/20 52.9 kg      BMI:  Body mass index is 18.27 kg/m.  Estimated Nutritional Needs:   Kcal:  1700-1900 kcals  Protein:  85-95 g  Fluid:  >/= 1.7 L  11/04/20 RD, LDN Clinical Nutrition Pager listed in AMION

## 2020-09-06 NOTE — Progress Notes (Signed)
NAME:  John Fields, MRN:  [id number], DOB:  [birth year], LOS: 8 ADMISSION DATE:  08/29/2020, CONSULTATION DATE:  08/29/2020 REFERRING MD:  Mendel Ryder - [facility], CHIEF COMPLAINT:  Hypoxia following cardiac arrest.    HPI/course in hospital  87 year old vasculopath presented with new onset RSCP without radiation or dyspnea, intermittently since 12/26 without clear relation to exertion.  Persistent chest pain since 0100. In ED, EKG showed anterior STEMI ( TIMI score 6). Suffered brief arrest in ED per cardiology (no documentation). Brought to cath lab for emergent revascularization.    Past Medical History  Review of recent medical history: AAA repaired EVAR 2010, chronic diarrhea, possibly statin related. PVD. COPD by history - not on home O2, no pulmonary follow-up, no recent PFTs.  Quit smoking several years ago.  Hospital course   12/31 critical care signed off, we had been seeing for shortness of breath in the setting of what was felt to be COPD exacerbation superimposed on underlying ST elevation acute coronary syndrome where he underwent PCI of the LAD 1/2 continuing to have mild  exertional dyspnea, actually having hypertension, losartan discontinued and placed on Entresto 1/3 still having minimal shortness of breath with activity had some chest tightness with cough, also endorsing occasionally coughing after eating typically mucus same color is what ever food he was eating, seen by heart failure team, repeat EF had improved from 35 to 40% up to 60 to 65% found to actually be hypotensive and because of this moved to the ICU for further evaluation 1/4: Critical care follow-up as back in the intensive care and antibiotic started, bedside ultrasound showing consolidation but no significant effusion 1/5: Modified barium swallow showed fairly significant dysphagia noting aspiration before and during the swallow with thin liquids, also aspirated with straw sips of nectar thick due to poor  timing.  Aspiration was largely silent with cough mechanics that were poor  Interim history/subjective:  Still short of breath with exertion Objective   Blood pressure 133/71, pulse 73, temperature 97.6 F (36.4 C), resp. rate (Abnormal) 26, height 5\' 7"  (1.702 m), weight 52.9 kg, SpO2 96 %. CVP:  [4 mmHg-6 mmHg] 4 mmHg      Intake/Output Summary (Last 24 hours) at 09/06/2020 1127 Last data filed at 09/06/2020 0600 Gross per 24 hour  Intake 715.66 ml  Output 700 ml  Net 15.66 ml   Filed Weights   09/04/20 0454 09/05/20 0600 09/06/20 0600  Weight: 54.6 kg 52.8 kg 52.9 kg    Examination:  Physical Exam General 87 year old male patient resting bed no acute distress HEENT normocephalic atraumatic no jugular venous distention Pulmonary: Clear to auscultation currently 3 L, no accessory use currently, diminished right base Cardiac: Regular rate and rhythm Abdomen soft nontender Extremities warm dry Neuro intact   Assessment & Plan:      Acute hypoxic respiratory insufficiency Status post VF cardiac arrest likely ischemic in origin. Ischemic cardiomyopathy, follow-up echocardiogram as of 1/3 shows improved LV function Calcified coronary artery disease Peripheral vascular disease Status post EVAR repair of AAA Possible microscopic colitis. Dyspnea with exertion Acute exacerbation of COPD AKI  Current problem list: acute ST elevation anterior wall myocardial infarction Killip class I, status post drug-eluting stent;  complicated by hypotension which was likely volume depletion versus early sepsis  Responded to IV fluids Plan Cont to hold diuretics Continue medical therapy  Aspiration PNA.  With exertional dyspnea, superimposed on underlying COPD, status post recent treatment for exacerbation ->MBS + for sig  dysphagia  ->esophagram pending but suspect refluxing as well Plan Continue supplemental oxygen day #3 cefepime, will complete 7 days Continue incentive  spirometry Continue scheduled bronchodilators Dysphagia 2 diet Nectar thick liquids Crush medications Reflux precautions   AKI Suspect exacerbated by volume depletion and hypotension, improving off diuretics Plan Cont to trend   Best practice   Per primary   Erick Colace ACNP-BC [place] Pager # [phone] OR # [phone] if no answer

## 2020-09-07 DIAGNOSIS — Z955 Presence of coronary angioplasty implant and graft: Secondary | ICD-10-CM | POA: Diagnosis not present

## 2020-09-07 DIAGNOSIS — Z9189 Other specified personal risk factors, not elsewhere classified: Secondary | ICD-10-CM

## 2020-09-07 DIAGNOSIS — R1310 Dysphagia, unspecified: Secondary | ICD-10-CM | POA: Diagnosis not present

## 2020-09-07 DIAGNOSIS — Z7189 Other specified counseling: Secondary | ICD-10-CM

## 2020-09-07 DIAGNOSIS — Z515 Encounter for palliative care: Secondary | ICD-10-CM

## 2020-09-07 DIAGNOSIS — I213 ST elevation (STEMI) myocardial infarction of unspecified site: Secondary | ICD-10-CM | POA: Diagnosis not present

## 2020-09-07 DIAGNOSIS — E43 Unspecified severe protein-calorie malnutrition: Secondary | ICD-10-CM | POA: Diagnosis not present

## 2020-09-07 DIAGNOSIS — I2102 ST elevation (STEMI) myocardial infarction involving left anterior descending coronary artery: Secondary | ICD-10-CM | POA: Diagnosis not present

## 2020-09-07 LAB — CBC
HCT: 31.3 % — ABNORMAL LOW (ref 39.0–52.0)
Hemoglobin: 10.3 g/dL — ABNORMAL LOW (ref 13.0–17.0)
MCH: 30.8 pg (ref 26.0–34.0)
MCHC: 32.9 g/dL (ref 30.0–36.0)
MCV: 93.7 fL (ref 80.0–100.0)
Platelets: 207 10*3/uL (ref 150–400)
RBC: 3.34 MIL/uL — ABNORMAL LOW (ref 4.22–5.81)
RDW: 14.4 % (ref 11.5–15.5)
WBC: 11.7 10*3/uL — ABNORMAL HIGH (ref 4.0–10.5)
nRBC: 0 % (ref 0.0–0.2)

## 2020-09-07 LAB — GLUCOSE, CAPILLARY
Glucose-Capillary: 112 mg/dL — ABNORMAL HIGH (ref 70–99)
Glucose-Capillary: 115 mg/dL — ABNORMAL HIGH (ref 70–99)
Glucose-Capillary: 86 mg/dL (ref 70–99)
Glucose-Capillary: 87 mg/dL (ref 70–99)
Glucose-Capillary: 99 mg/dL (ref 70–99)

## 2020-09-07 LAB — COOXEMETRY PANEL
Carboxyhemoglobin: 1.6 % — ABNORMAL HIGH (ref 0.5–1.5)
Carboxyhemoglobin: 1.8 % — ABNORMAL HIGH (ref 0.5–1.5)
Methemoglobin: 1 % (ref 0.0–1.5)
Methemoglobin: 0.6 % (ref 0.0–1.5)
O2 Saturation: 48.2 %
O2 Saturation: 67.5 %
Total hemoglobin: 10.6 g/dL — ABNORMAL LOW (ref 12.0–16.0)
Total hemoglobin: 9.8 g/dL — ABNORMAL LOW (ref 12.0–16.0)

## 2020-09-07 LAB — BASIC METABOLIC PANEL
Anion gap: 8 (ref 5–15)
BUN: 59 mg/dL — ABNORMAL HIGH (ref 8–23)
CO2: 25 mmol/L (ref 22–32)
Calcium: 8.5 mg/dL — ABNORMAL LOW (ref 8.9–10.3)
Chloride: 104 mmol/L (ref 98–111)
Creatinine, Ser: 1.65 mg/dL — ABNORMAL HIGH (ref 0.61–1.24)
GFR, Estimated: 40 mL/min — ABNORMAL LOW (ref >60)
Glucose, Bld: 100 mg/dL — ABNORMAL HIGH (ref 70–99)
Potassium: 4.7 mmol/L (ref 3.5–5.1)
Sodium: 137 mmol/L (ref 135–145)

## 2020-09-07 MED ORDER — AMOXICILLIN-POT CLAVULANATE 875-125 MG PO TABS: 1.0000 | ORAL_TABLET | Freq: Two times a day (BID) | ORAL | Status: DC

## 2020-09-07 MED ORDER — LEVOFLOXACIN 500 MG PO TABS
500.0000 mg | ORAL_TABLET | ORAL | Status: DC
  Administered 2020-09-07 – 2020-09-11 (×3): 500 mg via ORAL
  Filled 2020-09-07 (×3): qty 1

## 2020-09-07 MED ORDER — AMOXICILLIN-POT CLAVULANATE 500-125 MG PO TABS
1.0000 | ORAL_TABLET | Freq: Two times a day (BID) | ORAL | Status: DC
  Filled 2020-09-07 (×2): qty 1

## 2020-09-07 MED ORDER — LEVOFLOXACIN 750 MG PO TABS
750.0000 mg | ORAL_TABLET | ORAL | Status: DC
  Filled 2020-09-07 (×2): qty 1

## 2020-09-07 NOTE — Progress Notes (Addendum)
Advanced Heart Failure Rounding Note   Subjective:    1/3: Tx to ICU for profound hypotension and lactic acidosis requiring NE.  1/5: Txf to progressive unit  Today, appears tired, denies trouble breathing.  Notes pain but unable to elaborate where at.    Objective:   Weight Range:  Vital Signs:   Temp:  [97.5 F (36.4 C)-97.6 F (36.4 C)] 97.6 F (36.4 C) (01/06 0400) Pulse Rate:  [25-120] 90 (01/06 0400) Resp:  [15-30] 20 (01/06 0400) BP: (92-192)/(48-100) 131/64 (01/06 0400) SpO2:  [67 %-100 %] 100 % (01/06 0400) Weight:  [54 kg] 54 kg (01/06 0500) Last BM Date: 09/06/20  Weight change: Filed Weights   09/05/20 0600 09/06/20 0600 09/07/20 0500  Weight: 52.8 kg 52.9 kg 54 kg    Intake/Output:   Intake/Output Summary (Last 24 hours) at 09/07/2020 0645 Last data filed at 09/07/2020 0200 Gross per 24 hour  Intake 360 ml  Output 501 ml  Net -141 ml     Physical Exam: General:  Fatigue and cachetic.  HEENT: normal Neck: supple. no JVD.  Cor: Regular rate & rhythm.  Lungs: clear Abdomen: soft, nontender, nondistended. Good bowel sounds. Extremities: no cyanosis, clubbing, rash, edema Neuro: alert & oriented x 3, cranial nerves grossly intact. moves all 4 extremities w/o difficulty. Tired pleasant  Telemetry: NSR with rates 90s.  Personally reviewed.   Labs: Basic Metabolic Panel: Recent Labs  Lab 09/03/20 0226 09/04/20 1503 09/05/20 0511 09/06/20 0446 09/07/20 0341  NA 140 137 138 136 137  K 4.5 4.2 4.4 4.5 4.7  CL 106 99 104 101 104  CO2 25 25 26 25 25   GLUCOSE 110* 137* 104* 85 100*  BUN 59* 66* 67* 69* 59*  CREATININE 1.27* 1.62* 1.61* 1.59* 1.65*  CALCIUM 8.9 9.2 8.6* 8.1* 8.5*    Liver Function Tests: No results for input(s): AST, ALT, ALKPHOS, BILITOT, PROT, ALBUMIN in the last 168 hours. No results for input(s): LIPASE, AMYLASE in the last 168 hours. No results for input(s): AMMONIA in the last 168 hours.  CBC: Recent Labs  Lab  09/04/20 0150 09/04/20 1503 09/05/20 0511 09/06/20 0446 09/07/20 0341  WBC 9.7 13.8* 10.7* 10.1 11.7*  NEUTROABS  --  12.2*  --   --   --   HGB 9.9* 12.0* 9.9* 9.8* 10.3*  HCT 30.8* 38.2* 31.0* 29.4* 31.3*  MCV 93.9 94.3 93.9 93.3 93.7  PLT 157 251 168 182 207    Cardiac Enzymes: No results for input(s): CKTOTAL, CKMB, CKMBINDEX, TROPONINI in the last 168 hours.  BNP: BNP (last 3 results) Recent Labs    08/30/20 0520  BNP 1,345.1*    ProBNP (last 3 results) No results for input(s): PROBNP in the last 8760 hours.    Other results:  Imaging: DG Swallowing Func-Speech Pathology  Result Date: 09/06/2020 Objective Swallowing Evaluation: Type of Study: MBS-Modified Barium Swallow Study  Patient Details Name: Fain Francis MRN: Phylis Bougie Date of Birth: 08-01-1930 Today's Date: 09/06/2020 Time: SLP Start Time (ACUTE ONLY): 11/04/2020 -SLP Stop Time (ACUTE ONLY): 1002 SLP Time Calculation (min) (ACUTE ONLY): 24 min Past Medical History: Past Medical History: Diagnosis Date . Abdominal aortic aneurysm (AAA) (HCC)  . Anxiety  . Carotid artery disease (HCC)  . COPD (chronic obstructive pulmonary disease) (HCC)  . Hyperlipidemia  . Hypertension  . Peripheral vascular disease (HCC)  . Prostate cancer [facility])  Past Surgical History: Past Surgical History: Procedure Laterality Date . ABDOMINAL AORTIC ANEURYSM REPAIR   .  BACK SURGERY   . CAROTID ENDARTERECTOMY   . Cataract surgery   . CORONARY STENT INTERVENTION N/A 08/29/2020  Procedure: CORONARY STENT INTERVENTION;  Surgeon: Belva Crome, MD;  Location: [facility] [facility];  Service: Cardiovascular;  Laterality: N/A; . CORONARY/GRAFT ACUTE MI REVASCULARIZATION N/A 08/29/2020  Procedure: Coronary/Graft Acute MI Revascularization;  Surgeon: Belva Crome, MD;  Location: [place] [facility];  Service: Cardiovascular;  Laterality: N/A; . LEFT HEART CATH AND CORONARY ANGIOGRAPHY N/A 08/29/2020  Procedure: LEFT HEART CATH AND CORONARY ANGIOGRAPHY;  Surgeon: Belva Crome, MD;  Location: [place] [facility];  Service: Cardiovascular;  Laterality: N/A; HPI: Pt is an 87 yo male admitted with late presentation anterior MI. He suffered V-Fib arrest in the ED requiring transient CPR and defibrillation. Cardiac cath revealed occluded LAD and 70% Rt coronary artery s/p revascularizaion. PCCM involved for persistent dyspnea with concern for recurrent aspiration. CXR 1/3 revealed increased opacity in the RLL. PMH includes:  PVD, s/p AAA repair, COPD, reported difficulty swallowing since CEA approximately 5-6 years ago  Subjective: alert, cooperative Assessment / Plan / Recommendation CHL IP CLINICAL IMPRESSIONS 09/06/2020 Clinical Impression Pt presents with a pharyngeal and suspected esophageal dysphagia, with barium filling the esophagus upon completion of testing (esophagram to be completed after MBS). He has aspiration before and during the swallow with thin liquids and larger straw sips of nectar thick liquids due to a timing impairment and incomplete laryngeal closure. Aspiration is largely silent and his cough mechanics are poor with volitional attempts. When larger amounts of aspiration occurred he would have a delayed cough that sounded minimally stronger, but he could never clear his airway. Mild amounts of pharyngeal residue was noted with liquids more than solids, which may be more suggestive of esophageal component. Although oropharyngeal swallow was [facility] with solids, recommend continuing Dys 2 diet given concern for esophageal clearance. Would also crush medications and thicken liquids to nectar thick (no straws). Will f/u for dysphagia intervention to maximize safety. SLP Visit Diagnosis Dysphagia, pharyngoesophageal phase (R13.14) Attention and concentration deficit following -- Frontal lobe and executive function deficit following -- Impact on safety and function Moderate aspiration risk   CHL IP TREATMENT RECOMMENDATION 09/06/2020 Treatment Recommendations Therapy as  outlined in treatment plan below   Prognosis 09/06/2020 Prognosis for Safe Diet Advancement Good Barriers to Reach Goals Time post onset Barriers/Prognosis Comment -- CHL IP DIET RECOMMENDATION 09/06/2020 SLP Diet Recommendations Dysphagia 2 (Fine chop) solids;Nectar thick liquid Liquid Administration via Cup;No straw Medication Administration Crushed with puree Compensations Slow rate;Small sips/bites;Follow solids with liquid Postural Changes Seated upright at 90 degrees;Remain semi-upright after after feeds/meals (Comment)   CHL IP OTHER RECOMMENDATIONS 09/06/2020 Recommended Consults -- Oral Care Recommendations Oral care BID Other Recommendations --   CHL IP FOLLOW UP RECOMMENDATIONS 09/06/2020 Follow up Recommendations Skilled Nursing facility   [facility] IP FREQUENCY AND DURATION 09/06/2020 Speech Therapy Frequency (ACUTE ONLY) min 2x/week Treatment Duration 2 weeks      CHL IP ORAL PHASE 09/06/2020 Oral Phase WFL Oral - Pudding Teaspoon -- Oral - Pudding Cup -- Oral - Honey Teaspoon -- Oral - Honey Cup -- Oral - Nectar Teaspoon -- Oral - Nectar Cup -- Oral - Nectar Straw -- Oral - Thin Teaspoon -- Oral - Thin Cup -- Oral - Thin Straw -- Oral - Puree -- Oral - Mech Soft -- Oral - Regular -- Oral - Multi-Consistency -- Oral - Pill -- Oral Phase - Comment --  CHL IP PHARYNGEAL PHASE 09/06/2020  Pharyngeal Phase Impaired Pharyngeal- Pudding Teaspoon -- Pharyngeal -- Pharyngeal- Pudding Cup -- Pharyngeal -- Pharyngeal- Honey Teaspoon -- Pharyngeal -- Pharyngeal- Honey Cup -- Pharyngeal -- Pharyngeal- Nectar Teaspoon -- Pharyngeal -- Pharyngeal- Nectar Cup Reduced airway/laryngeal closure Pharyngeal -- Pharyngeal- Nectar Straw Reduced airway/laryngeal closure;Penetration/Aspiration before swallow Pharyngeal Material enters airway, passes BELOW cords and not ejected out despite cough attempt by patient Pharyngeal- Thin Teaspoon -- Pharyngeal -- Pharyngeal- Thin Cup Delayed swallow initiation-pyriform sinuses;Reduced airway/laryngeal  closure;Penetration/Aspiration during swallow Pharyngeal Material enters airway, passes BELOW cords without attempt by patient to eject out (silent aspiration) Pharyngeal- Thin Straw Delayed swallow initiation-pyriform sinuses;Reduced airway/laryngeal closure;Penetration/Aspiration during swallow Pharyngeal Material enters airway, passes BELOW cords without attempt by patient to eject out (silent aspiration) Pharyngeal- Puree Reduced airway/laryngeal closure Pharyngeal -- Pharyngeal- Mechanical Soft Reduced airway/laryngeal closure Pharyngeal -- Pharyngeal- Regular -- Pharyngeal -- Pharyngeal- Multi-consistency -- Pharyngeal -- Pharyngeal- Pill -- Pharyngeal -- Pharyngeal Comment --  CHL IP CERVICAL ESOPHAGEAL PHASE 09/06/2020 Cervical Esophageal Phase WFL Pudding Teaspoon -- Pudding Cup -- Honey Teaspoon -- Honey Cup -- Nectar Teaspoon -- Nectar Cup -- Nectar Straw -- Thin Teaspoon -- Thin Cup -- Thin Straw -- Puree -- Mechanical Soft -- Regular -- Multi-consistency -- Pill -- Cervical Esophageal Comment -- Osie Bond., M.A. CCC-SLP Acute Rehabilitation Services Pager [phone] Office [phone] 09/06/2020, 11:21 AM              DG ESOPHAGUS W SINGLE CM (SOL OR THIN BA)  Result Date: 09/06/2020 CLINICAL DATA:  Dysphagia. Aspiration on recent swallowing function test. EXAM: ESOPHOGRAM/BARIUM SWALLOW TECHNIQUE: Single contrast examination was performed using thin barium mixed with puree. FLUOROSCOPY TIME:  Fluoroscopy Time:  2 minutes 0 seconds Radiation Exposure Index (if provided by the fluoroscopic device): 5.6 mGy Number of Acquired Spot Images: 0 COMPARISON:  None. FINDINGS: Exam was limited by patient physical condition. Exam was performed in the LPO position using only a small amount of thin barium mixed with puree, as this consistency did not show aspiration on the recent swallowing function test with speech pathology. There is no evidence of esophageal obstruction. No evidence of esophageal stricture or  mass. Moderate to severe esophageal dysmotility is seen with lack of primary peristalsis resulting in esophageal stasis. No evidence of hiatal hernia. No gastroesophageal reflux was seen. IMPRESSION: Technically limited exam. Severe esophageal dysmotility. No evidence of esophageal stricture or mass. Electronically Signed   By: Marlaine Hind M.D.   On: 09/06/2020 12:52     Medications:     Scheduled Medications: . aspirin EC  81 mg Oral Daily  . busPIRone  15 mg Oral Daily  . Chlorhexidine Gluconate Cloth  6 each Topical Daily  . ezetimibe  10 mg Oral Daily  . guaiFENesin-dextromethorphan  5 mL Oral QID  . heparin injection (subcutaneous)  5,000 Units Subcutaneous Q8H  . losartan  25 mg Oral Daily  . sodium chloride flush  10-40 mL Intracatheter Q12H  . sodium chloride flush  3 mL Intravenous Q12H  . ticagrelor  90 mg Oral BID  . umeclidinium bromide  1 puff Inhalation Daily    Infusions: . sodium chloride Stopped (08/30/20 0817)  . sodium chloride    . sodium chloride 10 mL/hr at 09/06/20 0600  . ceFEPime (MAXIPIME) IV 2 g (09/06/20 1731)    PRN Medications: sodium chloride, acetaminophen, albuterol, clonazePAM, docusate sodium, loperamide, nitroGLYCERIN, ondansetron (ZOFRAN) IV, oxyCODONE, Resource ThickenUp Clear, sodium chloride flush, sodium chloride flush   Assessment/Plan:   1. Hypotension - unclear etiology. ?  Volume depletion/low output vs early sepsis.  - NE now off.  - Continue to hold diuretics.  - Continue cefepime possible RLL PNA. Blood CX NGTD; UCx grew > 100K providencia and 80K proteus, susceptibility pending.   2. CAD with acute anterior STEMI  - hstrop > 27K - Cardiac cath showed proximal to mid total occlusion of LAD treated with DES. Eccentric 50% stenosis proximal to the stent but no evidence of dissection. Very tortuous RCA supplying collaterals to mid and apical LAD. 70-80% stenosis of mid RCA beyond region of significant tortuosity.  - no s/s  angina - continue DAPT. Statin intolerant. On Zetia  - off b-blocker with labile BP and low cardiac output - consult CIR  3. Acute systolic HF due to ICM - Echo on presentation showed LVEF of 35-40% with grade IDD, and wall motion abnormalities. Repeat limited echo 1/3 EF 60-65%.   - EF much better than I would have suspected given cath results, slow troponin washout and persistent ST elevation of ECG. - co-ox 51%. Volume low. Off diuretics - No spiro/ACEi in setting of AKI, however losartan was started yesterday for HTN but will stop today due to labile BP - Hold home lopressor due to labile BP and low cardiac output  4. AKI - Baseline Cr around 1, today Cr 1.65 suspect ATN - UOP 500 mL, will need to watch volume status closely with nectar thicken liquids  5. COPD - stable, steroids were stopped.  - Continue inhalers daily  6. Anxiety - continue buspar and klonopin  7. Dysphagia -hx of CEA 5-6 years ago, since has had problems swallowing -Speech and swallow evaluation: esophagram noted severe esophageal dysmotility and silent aspiration with poor cough mechanics   8. DNR/DNI -Consult palliative care   Length of Stay: [place] NP 09/07/2020, 6:45 AM  Advanced Heart Failure Team Pager [phone] (M-F; [place])  Please contact [facility] for night-coverage after hours (4p -7a ) and weekends on [email or web address]   Patient seen and examined with the above-signed Advanced Practice Provider and/or Housestaff. I personally reviewed laboratory data, imaging studies and relevant notes. I independently examined the patient and formulated the important aspects of the plan. I have edited the note to reflect any of my changes or salient points. I have personally discussed the plan with the patient and/or family.  Feels miserable. More lethargic today. Denies CP. Co-ox 48% -> 67% Ucx + for providencia. Abx switched.  General:  Lying in bed lethargic. Weak appearing HEENT:  normal Neck: supple. no JVD. Carotids 2+ bilat; no bruits. No lymphadenopathy or thryomegaly appreciated. Cor: PMI nondisplaced. Regular rate & rhythm. No rubs, gallops or murmurs. Lungs: upper airway wheeze Abdomen: soft, nontender, nondistended. No hepatosplenomegaly. No bruits or masses. Good bowel sounds. Extremities: no cyanosis, clubbing, rash, edema Neuro: lethargic. Non-focal  Very tenuous. He seems to have lost the will to live. He says he wishes he didn't survive his heart attack. He is not candidate for advanced therapies. Will consule Palliative Care. I suspect best option is transition to [place]. Await Palliative Eval.   Glori Bickers, MD  3:26 PM

## 2020-09-07 NOTE — Consult Note (Signed)
Consultation Note Date: 09/07/2020   Patient Name: John Fields  DOB: [birth year]  MRN: [id number]  Age / Sex: 87 y.o., male   PCP: Patient, No Pcp Per Referring Physician: Belva Crome, MD  Reason for Consultation: Establishing goals of care  HPI/Patient Profile: 87 y.o. male  with past medical history of hypertension, hyperlipidemia, CAD, PVD, previous abdominal aortic aneurysm repair, prostate cancer, anxiety admitted on 08/29/2020 with chest discomfort and shortness of breath. Noted to have STEMI and brief cardiac arrest in ED. S/p PCI of LAD. During hospitalization, developed hypotension requiring transfer to ICU for pressors. Patient with dysphagia and aspiration. MBS reveals severe esophageal dysmotility, silent aspiration. Per SLP patient is at risk for aspiration both prandially and post-prandially given pharyngeal and esophageal dysphagia. Started on dysphagia 2, nectar thick liquids. Palliative medicine consultation for goals of care.   Clinical Assessment and Goals of Care:  I have reviewed medical records, discussed with care team, and met with patient at bedside. Mr. Ravi is sleeping comfortably. No family at bedside. Did not wake patient as daughter needs to be present for [place] discussion.   Shortly after, spoke with daughter Baker Janus via telephone.   I introduced Palliative Medicine as specialized medical care for people living with serious illness. It focuses on providing relief from the symptoms and stress of a serious illness. The goal is to improve quality of life for both the patient and the family.  We discussed a brief life review of the patient. Prior to admission, living home alone and independent. She shares he has had many 'bouts of colitis' but was still independent and able to do for himself.   Discussed course of hospitalization including diagnoses, interventions, plan of care, speech  recommendations. Baker Janus reports he has had esophageal dysmotility/difficulty swallowing for many years. She shares that her mother died from aspiration pneumonia and she understands severity of complications down the road due to aspiration.   Baker Janus confirms her father's decision for DNR/DNI. She is reported HCPOA.   Baker Janus can meet with her father and I tomorrow afternoon for [place] discussion. Requested she bring his living will/POA documentation to be scanned into EMR.   Introduced Teacher, adult education of MOST form that we will discuss tomorrow, allowing the opportunity to clearly document his wishes moving forward.   Baker Janus mentions her hope for SNF rehab on discharge. Explained that there was also a CIR recommendation by HF NP today.   Baker Janus and I plan to meet with her father tomorrow 1/7 at 12pm.     SUMMARY OF RECOMMENDATIONS    Brief initial palliative introduction and rapport development with daughter, Baker Janus.   [place] meeting 1/7 at 12pm with patient and daughter.   Daughter confirms patient's decision for DNR/DNI code status.  Requested living will/POA documentation for EMR. Daughter will bring tomorrow.   Continue current plan of care and medical management.   Code Status/Advance Care Planning:  DNR  Symptom Management:   Per attending  Palliative Prophylaxis:   Aspiration, Bowel Regimen, Delirium Protocol, Frequent Pain  Assessment, Oral Care and Turn Reposition  Psycho-social/Spiritual:   Desire for further Chaplaincy support:yes  Additional Recommendations: Caregiving  Support/Resources, Compassionate Wean Education and Education on Hospice  Prognosis:   Poor long-term  Discharge Planning: To Be Determined      Primary Diagnoses: Present on Admission: . STEMI (ST elevation myocardial infarction) ([place])   I have reviewed the medical record, interviewed the patient and family, and examined the patient. The following aspects are pertinent.  Past Medical History:  Diagnosis Date   . Abdominal aortic aneurysm (AAA) ([place])   . Anxiety   . Carotid artery disease ([place])   . COPD (chronic obstructive pulmonary disease) ([place])   . Hyperlipidemia   . Hypertension   . Peripheral vascular disease ([place])   . Prostate cancer [facility])    Social History   Socioeconomic History  . Marital status: Widowed    Spouse name: Not on file  . Number of children: 1  . Years of education: Not on file  . Highest education level: Not on file  Occupational History  . Not on file  Tobacco Use  . Smoking status: Former Research scientist (life sciences)  . Smokeless tobacco: Never Used  Substance and Sexual Activity  . Alcohol use: Not Currently  . Drug use: Not on file  . Sexual activity: Not on file  Other Topics Concern  . Not on file  Social History Narrative  . Not on file   Social Determinants of Health   Financial Resource Strain: Not on file  Food Insecurity: Not on file  Transportation Needs: Not on file  Physical Activity: Not on file  Stress: Not on file  Social Connections: Not on file   Family History  Problem Relation Age of Onset  . Heart disease Brother        Valve replacement   Scheduled Meds: . amoxicillin-clavulanate  1 tablet Oral BID  . aspirin EC  81 mg Oral Daily  . busPIRone  15 mg Oral Daily  . Chlorhexidine Gluconate Cloth  6 each Topical Daily  . ezetimibe  10 mg Oral Daily  . guaiFENesin-dextromethorphan  5 mL Oral QID  . heparin injection (subcutaneous)  5,000 Units Subcutaneous Q8H  . sodium chloride flush  10-40 mL Intracatheter Q12H  . sodium chloride flush  3 mL Intravenous Q12H  . ticagrelor  90 mg Oral BID  . umeclidinium bromide  1 puff Inhalation Daily   Continuous Infusions: . sodium chloride Stopped (08/30/20 0817)  . sodium chloride    . sodium chloride 10 mL/hr at 09/06/20 0600   PRN Meds:.sodium chloride, acetaminophen, albuterol, clonazePAM, docusate sodium, loperamide, nitroGLYCERIN, ondansetron (ZOFRAN) IV, oxyCODONE, Resource ThickenUp Clear,  sodium chloride flush, sodium chloride flush Medications Prior to Admission:  Prior to Admission medications   Medication Sig Start Date End Date Taking? Authorizing Provider  aspirin 81 MG EC tablet Take by mouth.   Yes [provider]  busPIRone (BUSPAR) 15 MG tablet Take 15 mg by mouth daily. 07/09/20  Yes [provider]  Cyanocobalamin 1000 MCG CAPS Take 1 tablet by mouth daily.   Yes [provider]  lisinopril (ZESTRIL) 40 MG tablet Take 40 mg by mouth daily. 07/09/20  Yes [provider]  LORazepam (ATIVAN) 0.5 MG tablet Take 0.25-0.5 mg by mouth at bedtime as needed for sleep. 05/14/20  Yes [provider]  metoprolol tartrate (LOPRESSOR) 100 MG tablet Take 50 mg by mouth 2 (two) times daily. 07/09/20  Yes [provider]  tamsulosin [facility])  0.4 MG CAPS capsule Take 0.4 mg by mouth daily. 07/27/20  Yes [provider]   Allergies  Allergen Reactions  . Requip [Ropinirole] Other (See Comments)    insomnia  . Statins Diarrhea   Review of Systems  Physical Exam-sleeping  Vital Signs: BP 129/63 (BP Location: Left Arm)   Pulse 93   Temp 97.9 F (36.6 C) (Oral)   Resp (!) 25   Ht '5\' 7"'  (1.702 m)   Wt 54 kg   SpO2 92%   BMI 18.65 kg/m  Pain Scale: 0-10 POSS *See Group Information*: 1-Acceptable,Awake and alert Pain Score: 0-No pain   SpO2: SpO2: 92 % O2 Device:SpO2: 92 % O2 Flow Rate: .O2 Flow Rate (L/min): 3 L/min  IO: Intake/output summary:   Intake/Output Summary (Last 24 hours) at 09/07/2020 1424 Last data filed at 09/07/2020 1300 Gross per 24 hour  Intake 600 ml  Output 501 ml  Net 99 ml    LBM: Last BM Date: 09/06/20 Baseline Weight: Weight: 53.1 kg Most recent weight: Weight: 54 kg     Palliative Assessment/Data: PPS 50%     Time Total: 30 Greater than 50%  of this time was spent counseling and coordinating care related to the above assessment and plan.  Signed by:  Ihor Dow, DNP,  FNP-C Palliative Medicine Team  Phone: [phone] Fax: [phone]   Please contact Palliative Medicine Team phone at [phone] for questions and concerns.  For individual provider: See Shea Evans

## 2020-09-07 NOTE — Progress Notes (Signed)
Physical Therapy Treatment Patient Details Name: John Fields MRN: [id number] DOB: 20-Aug-1930 Today's Date: 09/07/2020    History of Present Illness This 87 y.o. male admitted with late presentation anterior MI.  He suffered V-Fib arrest in the ED requiring transient CPR and defibrillation.  cardiac cath revealed occluded LAD and 70% Rt coronary artery underwent revascularizaion.   PMH includes:  PVD, s/p AAA repair, COPD    PT Comments    Pt asleep on entry, easily wakes however is very lethargic working with therapy. Pt limited by his lethargy and orthostatic hypotension. Pt is min A for bed mobility, transfers and sidestepping along side of bed with RW. D/c plans remain appropriate. After return to supine, pt quickly falls asleep. RN notified. PT will continue to follow acutely.  Orthostatic BPs  Supine 122/78  Sitting 113/67  Sitting after 3 min 121/69  Standing 92/48  With return to sitting  89/50  Supine 108/62      Follow Up Recommendations  SNF;Supervision/Assistance - 24 hour     Equipment Recommendations  Rolling walker with 5" wheels;3in1 (PT)    Recommendations for Other Services OT consult     Precautions / Restrictions Precautions Precautions: Fall Restrictions Weight Bearing Restrictions: No    Mobility  Bed Mobility Overal bed mobility: Needs Assistance Bed Mobility: Sit to Supine     Supine to sit: Min guard;HOB elevated Sit to supine: Min assist   General bed mobility comments: min guard for safety with coming to the EoB, min A for managing LE back into bed  Transfers Overall transfer level: Needs assistance   Transfers: Sit to/from Stand Sit to Stand: Min assist         General transfer comment: light min A for balance due to dizziness  Ambulation/Gait Ambulation/Gait assistance: Min assist Gait Distance (Feet): 3 Feet   Gait Pattern/deviations: Decreased stride length;Trunk flexed;Wide base of support;Drifts right/left;Step-to  pattern Gait velocity: slowed Gait velocity interpretation: <1.31 ft/sec, indicative of household ambulator General Gait Details: minA for steadying with stepping towards HoB to return to bed due to dizziness         Balance Overall balance assessment: Needs assistance Sitting-balance support: No upper extremity supported;Feet unsupported Sitting balance-Leahy Scale: Fair Sitting balance - Comments: able to sit EOb without UE support   Standing balance support: During functional activity;Bilateral upper extremity supported Standing balance-Leahy Scale: Poor Standing balance comment: reliant on UE support                            Cognition Arousal/Alertness: Awake/alert Behavior During Therapy: WFL for tasks assessed/performed Overall Cognitive Status: No family/caregiver present to determine baseline cognitive functioning Area of Impairment: Safety/judgement                         Safety/Judgement: Decreased awareness of safety;Decreased awareness of deficits     General Comments: Pt is a bit slow to process info, and demonstrates decreased safety awareness      Exercises      General Comments General comments (skin integrity, edema, etc.): Pt with symptomatic orthostatic hypotension, SaO2 on 3L O2 via [place] >88%O2 throughout session      Pertinent Vitals/Pain Pain Assessment: Faces Faces Pain Scale: Hurts a little bit Pain Location: uncomfortable Pain Descriptors / Indicators: Discomfort Pain Intervention(s): Limited activity within patient's tolerance;Monitored during session;Repositioned           PT Goals (current goals can  now be found in the care plan section) Acute Rehab PT Goals Patient Stated Goal: to breathe better PT Goal Formulation: With patient Time For Goal Achievement: 09/15/20 Potential to Achieve Goals: Fair Progress towards PT goals: Not progressing toward goals - comment (orthostatic hypotension)    Frequency     Min 3X/week      PT Plan Current plan remains appropriate       AM-PAC PT "6 Clicks" Mobility   Outcome Measure  Help needed turning from your back to your side while in a flat bed without using bedrails?: A Little Help needed moving from lying on your back to sitting on the side of a flat bed without using bedrails?: A Little Help needed moving to and from a bed to a chair (including a wheelchair)?: A Little Help needed standing up from a chair using your arms (e.g., wheelchair or bedside chair)?: A Little Help needed to walk in hospital room?: A Lot Help needed climbing 3-5 steps with a railing? : A Lot 6 Click Score: 16    End of Session Equipment Utilized During Treatment: Gait belt;Oxygen Activity Tolerance: Patient limited by fatigue;Treatment limited secondary to medical complications (Comment) (orthostatic hypotension) Patient left: with call bell/phone within reach;in bed;with bed alarm set Nurse Communication: Mobility status PT Visit Diagnosis: Other abnormalities of gait and mobility (R26.89);Difficulty in walking, not elsewhere classified (R26.2);Muscle weakness (generalized) (M62.81)     Time: 0454-0981 PT Time Calculation (min) (ACUTE ONLY): 18 min  Charges:  $Therapeutic Activity: 8-22 mins                     Timon Geissinger B. Beverely Risen PT, DPT Acute Rehabilitation Services Pager [phone] Office [phone]    Elon Alas Fleet 09/07/2020, 4:25 PM

## 2020-09-07 NOTE — Progress Notes (Signed)
PHARMACY NOTE:  ANTIMICROBIAL RENAL DOSAGE ADJUSTMENT  Current antimicrobial regimen includes a mismatch between antimicrobial dosage and estimated renal function.  As per policy approved by the Pharmacy & Therapeutics and Medical Executive Committees, the antimicrobial dosage will be adjusted accordingly.  Current antimicrobial dosage:  Augmentin 875-125 mg 1 tablet BID  Indication: Aspiration PNA  Renal Function:  Estimated Creatinine Clearance: 23.6 mL/min (A) (by C-G formula based on SCr of 1.65 mg/dL (H)). []      On intermittent HD, scheduled: []      On CRRT    Antimicrobial dosage has been changed to:  Augmentin 500 mg-125 mg 1 tablet BID given CrCl<30 mL/min  Additional comments:   Thank you for allowing pharmacy to be a part of this patient's care.  , PharmD, BCCCP Clinical Pharmacist  Phone: [phone] 09/07/2020 12:35 PM  Please check AMION for all [facility] phone numbers After 10:00 PM, call Main Pharmacy [phone]

## 2020-09-07 NOTE — Progress Notes (Signed)
  Speech Language Pathology Treatment: Dysphagia  Patient Details Name: John Fields MRN: [id number] DOB: 1929-10-26 Today's Date: 09/07/2020 Time: 0962-8366 SLP Time Calculation (min) (ACUTE ONLY): 11 min  Assessment / Plan / Recommendation Clinical Impression  Pt was seen today for education after MBS on previous date but without any skilled observation as pt declined any PO trials unless it was thin liquids. Education was reinforced from [facility] on previous date pertaining to results and recommendations, also noting results of esophagram that reveal severe esophageal dysmotility. He acknowledges information provided, but also says that the thickened liquids have all made him feel nauseous. This is likely a more chronic dysphagia, with or without an acute exacerbation from acute deconditioning. He is at risk for aspiration both prandially and post-prandially given pharyngeal and esophageal dysphagia, but he is also at risk for dehydration or malnutrition with diet modifications in place. Pt seems down today, and tells me a few times that he "doesn't know what to do." Recommend considering palliative care consult. Will keep current diet in place for now.   HPI HPI: Pt is an 87 yo male admitted with late presentation anterior MI. He suffered V-Fib arrest in the ED requiring transient CPR and defibrillation. Cardiac cath revealed occluded LAD and 70% Rt coronary artery s/p revascularizaion. PCCM involved for persistent dyspnea with concern for recurrent aspiration. CXR 1/3 revealed increased opacity in the RLL. PMH includes:  PVD, s/p AAA repair, COPD, reported difficulty swallowing since CEA approximately 5-6 years ago      SLP Plan  Continue with current plan of care       Recommendations  Diet recommendations: Dysphagia 2 (fine chop);Nectar-thick liquid Liquids provided via: Cup;No straw Medication Administration: Whole meds with puree Supervision: Patient able to self feed;Intermittent  supervision to cue for compensatory strategies Compensations: Slow rate;Small sips/bites;Follow solids with liquid (eat smaller, more frequent meals) Postural Changes and/or Swallow Maneuvers: Seated upright 90 degrees;Upright 30-60 min after meal                Oral Care Recommendations: Oral care BID Follow up Recommendations: Skilled Nursing facility SLP Visit Diagnosis: Dysphagia, pharyngoesophageal phase (R13.14) Plan: Continue with current plan of care       GO                Mahala Menghini., M.A. CCC-SLP Acute Rehabilitation Services Pager [phone] Office [phone]  09/07/2020, 11:56 AM

## 2020-09-07 NOTE — Plan of Care (Signed)
  Problem: Cardiovascular: Goal: Ability to achieve and maintain adequate cardiovascular perfusion will improve Outcome: Progressing Goal: Vascular access site(s) Level 0-1 will be maintained Outcome: Progressing   Problem: Activity: Goal: Ability to return to baseline activity level will improve Outcome: Progressing   Problem: Education: Goal: Knowledge of General Education information will improve Description: Including pain rating scale, medication(s)/side effects and non-pharmacologic comfort measures Outcome: Progressing   Problem: Clinical Measurements: Goal: Ability to maintain clinical measurements within normal limits will improve Outcome: Progressing Goal: Respiratory complications will improve Outcome: Progressing   Problem: Nutrition: Goal: Adequate nutrition will be maintained Outcome: Progressing   Problem: Coping: Goal: Level of anxiety will decrease Outcome: Not Progressing   Problem: Elimination: Goal: Will not experience complications related to bowel motility Outcome: Progressing Goal: Will not experience complications related to urinary retention Outcome: Progressing   Problem: Pain Managment: Goal: General experience of comfort will improve Outcome: Progressing

## 2020-09-07 NOTE — Progress Notes (Signed)
NAME:  John Fields, MRN:  [id number], DOB:  1930-01-19, LOS: 9 ADMISSION DATE:  08/29/2020, CONSULTATION DATE:  08/29/2020 REFERRING MD:  Tamala Julian, CHIEF COMPLAINT:  Dyspnea   Brief History:  87 y/o male presented with chest pain since 12/26, noted to have a STEMI. Had a brief cardiac arrest in ED.  Went to Cath lab.  After resuscitation noted to have aspiraiton pnemonia.   Past Medical History:  Review of recent medical history: AAA repaired EVAR 2010, chronic diarrhea, possibly statin related. PVD. COPD by history - not on home O2, no pulmonary follow-up, no recent PFTs.  Quit smoking several years ago.  Significant Hospital Events:   12/31 critical care signed off, we had been seeing for shortness of breath in the setting of what was felt to be COPD exacerbation superimposed on underlying ST elevation acute coronary syndrome where he underwent PCI of the LAD 1/2 continuing to have mild  exertional dyspnea, actually having hypertension, losartan discontinued and placed on Entresto 1/3 still having minimal shortness of breath with activity had some chest tightness with cough, also endorsing occasionally coughing after eating typically mucus same color is what ever food he was eating, seen by heart failure team, repeat EF had improved from 35 to 40% up to 60 to 65% found to actually be hypotensive and because of this moved to the ICU for further evaluation 1/4: Critical care follow-up as back in the intensive care and antibiotic started, bedside ultrasound showing consolidation but no significant effusion 1/5: Modified barium swallow showed fairly significant dysphagia noting aspiration before and during the swallow with thin liquids, also aspirated with straw sips of nectar thick due to poor timing.  Aspiration was largely silent with cough mechanics that were poor   Interim History / Subjective:  Feels about the same Breathing unchanged  Objective   Blood pressure 129/63, pulse 93,  temperature 97.9 F (36.6 C), temperature source Oral, resp. rate (!) 25, height 5\' 7"  (1.702 m), weight 54 kg, SpO2 92 %.        Intake/Output Summary (Last 24 hours) at 09/07/2020 1210 Last data filed at 09/07/2020 0800 Gross per 24 hour  Intake 360 ml  Output 501 ml  Net -141 ml   Filed Weights   09/05/20 0600 09/06/20 0600 09/07/20 0500  Weight: 52.8 kg 52.9 kg 54 kg   2L [place]  Examination: General:  Elderly male, resting comfortably in bed HENT: NCAT OP clear PULM: Upper airway wheeze, otherwise clear B, normal effort CV: RRR, no mgr GI: BS+, soft, nontender MSK: normal bulk and tone Neuro: awake, alert, no distress, MAEW   Resolved Hospital Problem list     Assessment & Plan:  COPD exacerbation Acute respiratory failure with hypoxemia Aspiration pneumonia -dysphagia diet -will need esophageal dysmotility evaluation> would do as outpatient -supplemental O2 for O2 sat > 90%, may need this on discharge -pulmonary hygiene -out of bed, incentive spirometry -incruise to conitnue, prn albuterol -will change cefepime to augmentin > plan 7 days of antibiotics total  PCCM will sign off rec outpatient pulmonary follow up  Best practice (evaluated daily)   Per Cardiology  Goals of Care:   DNR  Labs   CBC: Recent Labs  Lab 09/04/20 0150 09/04/20 1503 09/05/20 0511 09/06/20 0446 09/07/20 0341  WBC 9.7 13.8* 10.7* 10.1 11.7*  NEUTROABS  --  12.2*  --   --   --   HGB 9.9* 12.0* 9.9* 9.8* 10.3*  HCT 30.8* 38.2* 31.0* 29.4* 31.3*  MCV 93.9 94.3 93.9 93.3 93.7  PLT 157 251 168 182 207    Basic Metabolic Panel: Recent Labs  Lab 09/03/20 0226 09/04/20 1503 09/05/20 0511 09/06/20 0446 09/07/20 0341  NA 140 137 138 136 137  K 4.5 4.2 4.4 4.5 4.7  CL 106 99 104 101 104  CO2 25 25 26 25 25   GLUCOSE 110* 137* 104* 85 100*  BUN 59* 66* 67* 69* 59*  CREATININE 1.27* 1.62* 1.61* 1.59* 1.65*  CALCIUM 8.9 9.2 8.6* 8.1* 8.5*   GFR: Estimated Creatinine  Clearance: 23.6 mL/min (A) (by C-G formula based on SCr of 1.65 mg/dL (H)). Recent Labs  Lab 09/04/20 1503 09/04/20 1531 09/04/20 1855 09/05/20 0511 09/05/20 1110 09/06/20 0446 09/07/20 0341  WBC 13.8*  --   --  10.7*  --  10.1 11.7*  LATICACIDVEN  --  2.8* 2.7*  --  1.3  --   --     Liver Function Tests: No results for input(s): AST, ALT, ALKPHOS, BILITOT, PROT, ALBUMIN in the last 168 hours. No results for input(s): LIPASE, AMYLASE in the last 168 hours. No results for input(s): AMMONIA in the last 168 hours.  ABG    Component Value Date/Time   TCO2 22 08/29/2020 1200   O2SAT 48.2 09/07/2020 0730     Coagulation Profile: No results for input(s): INR, PROTIME in the last 168 hours.  Cardiac Enzymes: No results for input(s): CKTOTAL, CKMB, CKMBINDEX, TROPONINI in the last 168 hours.  HbA1C: Hgb A1c MFr Bld  Date/Time Value Ref Range Status  08/29/2020 10:43 AM 5.3 4.8 - 5.6 % Final    Comment:    (NOTE) Pre diabetes:          5.7%-6.4%  Diabetes:              >6.4%  Glycemic control for   <7.0% adults with diabetes     CBG: Recent Labs  Lab 09/06/20 1921 09/06/20 2345 09/07/20 0504 09/07/20 0845 09/07/20 1101  GLUCAP 150* 93 86 112* 99     Critical care time: n/a    11/05/20, MD [place] P[facility]M Pager: [phone] Cell: [phone] If no response, call [phone]

## 2020-09-08 DIAGNOSIS — R1310 Dysphagia, unspecified: Secondary | ICD-10-CM | POA: Diagnosis not present

## 2020-09-08 DIAGNOSIS — I213 ST elevation (STEMI) myocardial infarction of unspecified site: Secondary | ICD-10-CM | POA: Diagnosis not present

## 2020-09-08 DIAGNOSIS — Z955 Presence of coronary angioplasty implant and graft: Secondary | ICD-10-CM | POA: Diagnosis not present

## 2020-09-08 DIAGNOSIS — E43 Unspecified severe protein-calorie malnutrition: Secondary | ICD-10-CM | POA: Diagnosis not present

## 2020-09-08 LAB — URINE CULTURE: Culture: >=100000 — AB

## 2020-09-08 LAB — BASIC METABOLIC PANEL
Anion gap: 7 (ref 5–15)
BUN: 49 mg/dL — ABNORMAL HIGH (ref 8–23)
CO2: 26 mmol/L (ref 22–32)
Calcium: 8.7 mg/dL — ABNORMAL LOW (ref 8.9–10.3)
Chloride: 107 mmol/L (ref 98–111)
Creatinine, Ser: 1.68 mg/dL — ABNORMAL HIGH (ref 0.61–1.24)
GFR, Estimated: 39 mL/min — ABNORMAL LOW (ref >60)
Glucose, Bld: 95 mg/dL (ref 70–99)
Potassium: 4.9 mmol/L (ref 3.5–5.1)
Sodium: 140 mmol/L (ref 135–145)

## 2020-09-08 LAB — CBC
HCT: 29.7 % — ABNORMAL LOW (ref 39.0–52.0)
Hemoglobin: 9.2 g/dL — ABNORMAL LOW (ref 13.0–17.0)
MCH: 30.2 pg (ref 26.0–34.0)
MCHC: 31 g/dL (ref 30.0–36.0)
MCV: 97.4 fL (ref 80.0–100.0)
Platelets: 185 10*3/uL (ref 150–400)
RBC: 3.05 MIL/uL — ABNORMAL LOW (ref 4.22–5.81)
RDW: 14.5 % (ref 11.5–15.5)
WBC: 9.4 10*3/uL (ref 4.0–10.5)
nRBC: 0 % (ref 0.0–0.2)

## 2020-09-08 LAB — GLUCOSE, CAPILLARY
Glucose-Capillary: 85 mg/dL (ref 70–99)
Glucose-Capillary: 99 mg/dL (ref 70–99)

## 2020-09-08 MED ORDER — LACTATED RINGERS IV BOLUS
500.0000 mL | Freq: Once | INTRAVENOUS | Status: AC
  Administered 2020-09-08: 500 mL via INTRAVENOUS

## 2020-09-08 MED ORDER — ALUM & MAG HYDROXIDE-SIMETH 200-200-20 MG/5ML PO SUSP
30.0000 mL | Freq: Four times a day (QID) | ORAL | Status: DC | PRN
Start: 2020-09-08 — End: 2020-09-13
  Administered 2020-09-08: 30 mL via ORAL
  Filled 2020-09-08: qty 30

## 2020-09-08 NOTE — Plan of Care (Signed)
  Problem: Education: Goal: Understanding of CV disease, CV risk reduction, and recovery process will improve Outcome: Progressing Goal: Individualized Educational Video(s) Outcome: Progressing   Problem: Activity: Goal: Ability to return to baseline activity level will improve Outcome: Progressing   Problem: Cardiovascular: Goal: Ability to achieve and maintain adequate cardiovascular perfusion will improve Outcome: Progressing Goal: Vascular access site(s) Level 0-1 will be maintained Outcome: Progressing   Problem: Health Behavior/Discharge Planning: Goal: Ability to safely manage health-related needs after discharge will improve Outcome: Progressing   Problem: Education: Goal: Knowledge of General Education information will improve Description: Including pain rating scale, medication(s)/side effects and non-pharmacologic comfort measures Outcome: Progressing   Problem: Health Behavior/Discharge Planning: Goal: Ability to manage health-related needs will improve Outcome: Progressing   Problem: Clinical Measurements: Goal: Ability to maintain clinical measurements within normal limits will improve Outcome: Progressing Goal: Will remain free from infection Outcome: Progressing Goal: Diagnostic test results will improve Outcome: Progressing Goal: Respiratory complications will improve Outcome: Progressing Goal: Cardiovascular complication will be avoided Outcome: Progressing   Problem: Activity: Goal: Risk for activity intolerance will decrease Outcome: Progressing   Problem: Nutrition: Goal: Adequate nutrition will be maintained Outcome: Progressing   Problem: Elimination: Goal: Will not experience complications related to bowel motility Outcome: Progressing Goal: Will not experience complications related to urinary retention Outcome: Progressing   Problem: Coping: Goal: Level of anxiety will decrease Outcome: Progressing   Problem: Pain Managment: Goal:  General experience of comfort will improve Outcome: Progressing   Problem: Safety: Goal: Ability to remain free from injury will improve Outcome: Progressing   Problem: Skin Integrity: Goal: Risk for impaired skin integrity will decrease Outcome: Progressing

## 2020-09-08 NOTE — Progress Notes (Signed)
Occupational Therapy Treatment Patient Details Name: John Fields MRN: [id number] DOB: [birth year] Today's Date: 09/08/2020    History of present illness This 87 y.o. male admitted with late presentation anterior MI.  He suffered V-Fib arrest in the ED requiring transient CPR and defibrillation.  cardiac cath revealed occluded LAD and 70% Rt coronary artery underwent revascularizaion.   PMH includes:  PVD, s/p AAA repair, COPD   OT comments  Pt. Seen for skilled OT treatment session.  States he is not feeling well but agreeable to bed mobility and eob.  Min a for bed mobility in/out of bed with verbal and tactile cues for hand placement on rails.  Able to scoot towards hob x3 prior to lying back down.    Follow Up Recommendations       Equipment Recommendations       Recommendations for Other Services      Precautions / Restrictions Precautions Precautions: Fall       Mobility Bed Mobility Overal bed mobility: Needs Assistance Bed Mobility: Rolling;Sidelying to Sit;Sit to Sidelying Rolling: Min assist Sidelying to sit: Min assist     Sit to sidelying: Min assist General bed mobility comments: cues for hand placement and to reach for bed rails. would reach, hold then let go. cues to cont. to hold during the rolling.  Transfers Overall transfer level: Needs assistance   Transfers: Sit to/from Stand           General transfer comment: partial sit/stand for side scoots x3 towards hob prior to lying back down.  able to sit un supported without assistance    Balance                                           ADL either performed or assessed with clinical judgement   ADL Overall ADL's : Needs assistance/impaired                                       General ADL Comments: pt. reports he doesnt feel very well but cant describe what doesnt feel well.  agreeable to light bed mobility and eob but nothing else     Vision        Perception     Praxis      Cognition Arousal/Alertness: Lethargic Behavior During Therapy: WFL for tasks assessed/performed Overall Cognitive Status: No family/caregiver present to determine baseline cognitive functioning                                          Exercises     Shoulder Instructions       General Comments  pt. States he likes to sit on his porch and watch the goats across the street during the day when hes at home    Pertinent Vitals/ Pain       Pain Assessment: No/denies pain  Home Living                                          Prior Functioning/Environment  Frequency           Progress Toward Goals  OT Goals(current goals can now be found in the care plan section)  Progress towards OT goals: Progressing toward goals     Plan      Co-evaluation                 AM-PAC OT "6 Clicks" Daily Activity     Outcome Measure                    End of Session        Activity Tolerance     Patient Left     Nurse Communication          Time: 1012-1020 OT Time Calculation (min): 8 min  Charges: OT General Charges $OT Visit: 1 Visit OT Treatments $Self Care/Home Management : 8-22 mins  John Fields, COTA/L Acute Rehabilitation [phone]   Janice Coffin 09/08/2020, 1:09 PM

## 2020-09-08 NOTE — Progress Notes (Signed)
Daily Progress Note   Patient Name: John Fields       Date: 09/08/2020 DOB: [birth year]  Age: 87 y.o. MRN#: [id number] Attending Physician: Belva Crome, MD Primary Care Physician: Patient, No Pcp Per Admit Date: 08/29/2020  Reason for Consultation/Follow-up: Establishing goals of care  Subjective: Patient awake, alert, oriented and able to participate in discussion. Mr. Hashem is intermittently short of breath and appears weak and deconditioned. He took bites of lunch.    GOC:  Daughter, Baker Janus at bedside.   Introduced role of palliative medicine.   Prior to hospitalization, patient was living home and fairly independent. Baker Janus is an only child and checks in him on regularly.   Discussed events leading up to admission and course of hospitalization including diagnoses, interventions, plan of care. Discussed ongoing risk for aspiration due to esophageal dysmotility and complications of aspiration. Also weak heart.  I attempted to elicit values and goals of care important to the patient and daughter. Unfortunately, he is too weak to return home following hospitalization. Patient and daughter are agreeable with SNF rehab attempt with hopes he will be able to return home. Patient states he is 'willing to do anything.'   Advanced directives, concepts specific to code status, artifical feeding and hydration, and rehospitalization were considered and discussed. Daughter presents patient's documented living will. Placed in paper chart to be scanned into EMR. Patient confirms his wishes against life-prolonging measures if terminal or incurable.    Patient and daughter wish to complete MOST form this afternoon. Patient decisions include: DNR/DNI, limited additional interventions including  re-hospitalization for IVF/ABX if indicated, patient does NOT wish to be placed on CPAP or BiPAP, IVF/ABX if indicated, and NO feeding tube. Discussed comfort feeds and aspiration precautions. Durable DNR completed. Unfortunately unable to complete electronic [facility] MOST today due to system issue. Copy of MOST and durable DNR placed in chart and given to daughter.   Patient becomes tearful during discussion. He has ongoing anxiety that he does report relief from prn klonopin and scheduled buspar. Patient tells his daughter and I thoughts that he wishes to fall asleep and not wake back up. He is 'tired' and quality of life has been declining in the last two years from ongoing episodes of colitis. Patient denies suicidal ideations/plan. He declines chaplain visit. Emotional support and therapeutic  listening provided.    Further discussed plan for SNF rehab with recommendation for outpatient palliative, explaining if he continues to decline and does not wish for recurrent hospitalization, hospice service may benefit him. Explained hospice philosophy.   Hard Choices booklet give to daughter. Patient/daughter agreeable with outpatient palliative support.    Length of Stay: 10  Current Medications: Scheduled Meds:  . aspirin EC  81 mg Oral Daily  . busPIRone  15 mg Oral Daily  . Chlorhexidine Gluconate Cloth  6 each Topical Daily  . ezetimibe  10 mg Oral Daily  . guaiFENesin-dextromethorphan  5 mL Oral QID  . heparin injection (subcutaneous)  5,000 Units Subcutaneous Q8H  . [START ON 09/09/2020] levofloxacin  500 mg Oral Q48H  . sodium chloride flush  10-40 mL Intracatheter Q12H  . sodium chloride flush  3 mL Intravenous Q12H  . ticagrelor  90 mg Oral BID  . umeclidinium bromide  1 puff Inhalation Daily    Continuous Infusions: . sodium chloride Stopped (08/30/20 0817)  . sodium chloride    . sodium chloride 10 mL/hr at 09/06/20 0600    PRN Meds: sodium chloride, acetaminophen, albuterol,  clonazePAM, docusate sodium, loperamide, nitroGLYCERIN, ondansetron (ZOFRAN) IV, oxyCODONE, Resource ThickenUp Clear, sodium chloride flush, sodium chloride flush  Physical Exam Vitals and nursing note reviewed.  Constitutional:      General: He is awake.     Appearance: He is ill-appearing.  HENT:     Head: Normocephalic and atraumatic.  Cardiovascular:     Rate and Rhythm: Normal rate.  Pulmonary:     Effort: No tachypnea, accessory muscle usage or respiratory distress.     Comments: 3L [place] Skin:    General: Skin is warm and dry.  Neurological:     Mental Status: He is alert and oriented to person, place, and time.  Psychiatric:        Mood and Affect: Mood is anxious.        Speech: Speech normal.        Behavior: Behavior normal.        Cognition and Memory: Cognition normal.            Vital Signs: BP (!) 159/81   Pulse 91   Temp 97.6 F (36.4 C)   Resp 20   Ht 5\' 7"  (1.702 m)   Wt 52.4 kg   SpO2 96%   BMI 18.09 kg/m  SpO2: SpO2: 96 % O2 Device: O2 Device: Nasal Cannula O2 Flow Rate: O2 Flow Rate (L/min): 3 L/min  Intake/output summary:   Intake/Output Summary (Last 24 hours) at 09/08/2020 1046 Last data filed at 09/08/2020 0955 Gross per 24 hour  Intake 956.31 ml  Output 650 ml  Net 306.31 ml   LBM: Last BM Date: 09/06/20 Baseline Weight: Weight: 53.1 kg Most recent weight: Weight: 52.4 kg       Palliative Assessment/Data: PPS 40%      Patient Active Problem List   Diagnosis Date Noted  . Protein-calorie malnutrition, severe 08/31/2020  . STEMI (ST elevation myocardial infarction) ([place]) 08/29/2020    Palliative Care Assessment & Plan   Patient Profile: 87 y.o. male  with past medical history of hypertension, hyperlipidemia, CAD, PVD, previous abdominal aortic aneurysm repair, prostate cancer, anxiety admitted on 08/29/2020 with chest discomfort and shortness of breath. Noted to have STEMI and brief cardiac arrest in ED. S/p PCI of LAD. During  hospitalization, developed hypotension requiring transfer to ICU for pressors. Patient with dysphagia and aspiration.  MBS reveals severe esophageal dysmotility, silent aspiration. Per SLP patient is at risk for aspiration both prandially and post-prandially given pharyngeal and esophageal dysphagia. Started on dysphagia 2, nectar thick liquids. Palliative medicine consultation for goals of care.   Assessment: CAD STEMI s/p PCI Acute systolic heart failure ICM AKI COPD Dysphagia Esophageal dysmotility Anxiety Aspiration risk  Recommendations/Plan:   Copy of patient's living will placed in paper chart to be scanned into EMR.  Patient/daughter ready to complete MOST form. Patient's decisions include: DNR/DNI, NO CPAP/BiPAP, limited additional interventions including rehospitalization for IVF/ABX/cardiac monitoring if indicated, and NO feeding tube. Durable DNR completed. Copies placed in chart and given to daughter. Unable to complete electronic [facility] MOST form due to system issue.  Patient/daughter planning for SNF rehab on discharge. Agreeable for outpatient palliative f/u at SNF. Briefly introduced hospice philosophy in the near future if he continues to decline at [facility] and/or ready for comfort pathway and preventing recurrent hospitalizations.   Continue current diet and aspiration precautions. Patient would never want a feeding tube. Comfort feeds.   TOC for outpatient palliative at SNF.  Code Status: DNR/DNI   Code Status Orders  (From admission, onward)         Start     Ordered   09/05/20 2321  Do not attempt resuscitation (DNR)  Continuous       Question Answer Comment  In the event of cardiac or respiratory ARREST Do not call a "code blue"   In the event of cardiac or respiratory ARREST Do not perform Intubation, CPR, defibrillation or ACLS   In the event of cardiac or respiratory ARREST Use medication by any route, position, wound care, and other measures to relive pain  and suffering. May use oxygen, suction and manual treatment of airway obstruction as needed for comfort.      09/05/20 2321        Code Status History    Date Active Date Inactive Code Status Order ID Comments User Context   09/04/2020 1459 09/05/2020 2321 Partial Code [id number]  Sanda Klein, MD Inpatient   08/29/2020 1219 09/04/2020 1459 Full Code [id number]  Kipp Brood, MD Inpatient   Advance Care Planning Activity       Prognosis:  Poor long-term  Discharge Planning:  [place] for rehab with Palliative care service follow-up  Care plan was discussed with RN, patient, daughter Baker Janus)  Thank you for allowing the Palliative Medicine Team to assist in the care of this patient.   Total Time 60 Prolonged Time Billed  no      Greater than 50%  of this time was spent counseling and coordinating care related to the above assessment and plan.  Ihor Dow, DNP, FNP-C Palliative Medicine Team  Phone: [phone] Fax: [phone]  Please contact Palliative Medicine Team phone at [phone] for questions and concerns.

## 2020-09-08 NOTE — Progress Notes (Addendum)
Advanced Heart Failure Rounding Note   Subjective:    12.28: Presented with acute STEMI s/p DES LAD 1/3: Tx to ICU for profound hypotension and lactic acidosis requiring NE.  1/5: Txf to progressive unit  Today, alert and oriented x 3, fatigue, hard time keeping head up.  Reports did not sleep well last night, couldn't get comfortable.  Reports coughing up today after meal.  He notes daughter will be visiting around 75.    Objective:   Weight Range:  Vital Signs:   Temp:  [97.8 F (36.6 C)-98 F (36.7 C)] 97.8 F (36.6 C) (01/07 0406) Pulse Rate:  [86-102] 90 (01/07 0406) Resp:  [17-29] 19 (01/07 0406) BP: (91-153)/(45-69) 141/60 (01/07 0406) SpO2:  [90 %-100 %] 100 % (01/07 0406) Weight:  [52.4 kg] 52.4 kg (01/07 0406) Last BM Date: 09/06/20  Weight change: Filed Weights   09/06/20 0600 09/07/20 0500 09/08/20 0406  Weight: 52.9 kg 54 kg 52.4 kg    Intake/Output:   Intake/Output Summary (Last 24 hours) at 09/08/2020 0630 Last data filed at 09/08/2020 0400 Gross per 24 hour  Intake 946.31 ml  Output 650 ml  Net 296.31 ml     Physical Exam: General:  Cachectic and fatigue HEENT: normal Neck: supple. no JVD.   Cor: Regular rate & rhythm. No rubs, gallops or murmurs. Lungs: Clear Abdomen: soft, nontender, nondistended. No hepatosplenomegaly. No bruits or masses. Good bowel sounds. Extremities: no cyanosis, clubbing, rash, edema Neuro: alert & oriented x 3,  moves all 4 extremities w/o difficulty. Fatigue pleasant   Telemetry: NSR on telemetry with rates 90s.  Personally reviewed.   Labs: Basic Metabolic Panel: Recent Labs  Lab 09/04/20 1503 09/05/20 0511 09/06/20 0446 09/07/20 0341 09/08/20 0512  NA 137 138 136 137 140  K 4.2 4.4 4.5 4.7 4.9  CL 99 104 101 104 107  CO2 25 26 25 25 26   GLUCOSE 137* 104* 85 100* 95  BUN 66* 67* 69* 59* 49*  CREATININE 1.62* 1.61* 1.59* 1.65* 1.68*  CALCIUM 9.2 8.6* 8.1* 8.5* 8.7*    Liver Function Tests: No  results for input(s): AST, ALT, ALKPHOS, BILITOT, PROT, ALBUMIN in the last 168 hours. No results for input(s): LIPASE, AMYLASE in the last 168 hours. No results for input(s): AMMONIA in the last 168 hours.  CBC: Recent Labs  Lab 09/04/20 1503 09/05/20 0511 09/06/20 0446 09/07/20 0341 09/08/20 0512  WBC 13.8* 10.7* 10.1 11.7* 9.4  NEUTROABS 12.2*  --   --   --   --   HGB 12.0* 9.9* 9.8* 10.3* 9.2*  HCT 38.2* 31.0* 29.4* 31.3* 29.7*  MCV 94.3 93.9 93.3 93.7 97.4  PLT 251 168 182 207 185    Cardiac Enzymes: No results for input(s): CKTOTAL, CKMB, CKMBINDEX, TROPONINI in the last 168 hours.  BNP: BNP (last 3 results) Recent Labs    08/30/20 0520  BNP 1,345.1*    ProBNP (last 3 results) No results for input(s): PROBNP in the last 8760 hours.    Other results:  Imaging: DG Swallowing Func-Speech Pathology  Result Date: 09/06/2020 Objective Swallowing Evaluation: Type of Study: MBS-Modified Barium Swallow Study  Patient Details Name: Titus Rippel MRN: [id number] Date of Birth: 14-May-1930 Today's Date: 09/06/2020 Time: SLP Start Time (ACUTE ONLY): [id number] -SLP Stop Time (ACUTE ONLY): 1002 SLP Time Calculation (min) (ACUTE ONLY): 24 min Past Medical History: Past Medical History: Diagnosis Date . Abdominal aortic aneurysm (AAA) ([place])  . Anxiety  . Carotid artery disease ([place])  .  COPD (chronic obstructive pulmonary disease) ([place])  . Hyperlipidemia  . Hypertension  . Peripheral vascular disease ([place])  . Prostate cancer [facility])  Past Surgical History: Past Surgical History: Procedure Laterality Date . ABDOMINAL AORTIC ANEURYSM REPAIR   . BACK SURGERY   . CAROTID ENDARTERECTOMY   . Cataract surgery   . CORONARY STENT INTERVENTION N/A 08/29/2020  Procedure: CORONARY STENT INTERVENTION;  Surgeon: Belva Crome, MD;  Location: [place] CV LAB;  Service: Cardiovascular;  Laterality: N/A; . CORONARY/GRAFT ACUTE MI REVASCULARIZATION N/A 08/29/2020  Procedure: Coronary/Graft Acute MI  Revascularization;  Surgeon: Belva Crome, MD;  Location: [place] CV LAB;  Service: Cardiovascular;  Laterality: N/A; . LEFT HEART CATH AND CORONARY ANGIOGRAPHY N/A 08/29/2020  Procedure: LEFT HEART CATH AND CORONARY ANGIOGRAPHY;  Surgeon: Belva Crome, MD;  Location: [place] CV LAB;  Service: Cardiovascular;  Laterality: N/A; HPI: Pt is an 87 yo male admitted with late presentation anterior MI. He suffered V-Fib arrest in the ED requiring transient CPR and defibrillation. Cardiac cath revealed occluded LAD and 70% Rt coronary artery s/p revascularizaion. PCCM involved for persistent dyspnea with concern for recurrent aspiration. CXR 1/3 revealed increased opacity in the RLL. PMH includes:  PVD, s/p AAA repair, COPD, reported difficulty swallowing since CEA approximately 5-6 years ago  Subjective: alert, cooperative Assessment / Plan / Recommendation CHL IP CLINICAL IMPRESSIONS 09/06/2020 Clinical Impression Pt presents with a pharyngeal and suspected esophageal dysphagia, with barium filling the esophagus upon completion of testing (esophagram to be completed after MBS). He has aspiration before and during the swallow with thin liquids and larger straw sips of nectar thick liquids due to a timing impairment and incomplete laryngeal closure. Aspiration is largely silent and his cough mechanics are poor with volitional attempts. When larger amounts of aspiration occurred he would have a delayed cough that sounded minimally stronger, but he could never clear his airway. Mild amounts of pharyngeal residue was noted with liquids more than solids, which may be more suggestive of esophageal component. Although oropharyngeal swallow was [facility] with solids, recommend continuing Dys 2 diet given concern for esophageal clearance. Would also crush medications and thicken liquids to nectar thick (no straws). Will f/u for dysphagia intervention to maximize safety. SLP Visit Diagnosis Dysphagia, pharyngoesophageal phase  (R13.14) Attention and concentration deficit following -- Frontal lobe and executive function deficit following -- Impact on safety and function Moderate aspiration risk   CHL IP TREATMENT RECOMMENDATION 09/06/2020 Treatment Recommendations Therapy as outlined in treatment plan below   Prognosis 09/06/2020 Prognosis for Safe Diet Advancement Good Barriers to Reach Goals Time post onset Barriers/Prognosis Comment -- CHL IP DIET RECOMMENDATION 09/06/2020 SLP Diet Recommendations Dysphagia 2 (Fine chop) solids;Nectar thick liquid Liquid Administration via Cup;No straw Medication Administration Crushed with puree Compensations Slow rate;Small sips/bites;Follow solids with liquid Postural Changes Seated upright at 90 degrees;Remain semi-upright after after feeds/meals (Comment)   CHL IP OTHER RECOMMENDATIONS 09/06/2020 Recommended Consults -- Oral Care Recommendations Oral care BID Other Recommendations --   CHL IP FOLLOW UP RECOMMENDATIONS 09/06/2020 Follow up Recommendations Skilled Nursing facility   [facility] IP FREQUENCY AND DURATION 09/06/2020 Speech Therapy Frequency (ACUTE ONLY) min 2x/week Treatment Duration 2 weeks      CHL IP ORAL PHASE 09/06/2020 Oral Phase WFL Oral - Pudding Teaspoon -- Oral - Pudding Cup -- Oral - Honey Teaspoon -- Oral - Honey Cup -- Oral - Nectar Teaspoon -- Oral - Nectar Cup -- Oral - Nectar Straw -- Oral - Thin Teaspoon -- Oral -  Thin Cup -- Oral - Thin Straw -- Oral - Puree -- Oral - Mech Soft -- Oral - Regular -- Oral - Multi-Consistency -- Oral - Pill -- Oral Phase - Comment --  CHL IP PHARYNGEAL PHASE 09/06/2020 Pharyngeal Phase Impaired Pharyngeal- Pudding Teaspoon -- Pharyngeal -- Pharyngeal- Pudding Cup -- Pharyngeal -- Pharyngeal- Honey Teaspoon -- Pharyngeal -- Pharyngeal- Honey Cup -- Pharyngeal -- Pharyngeal- Nectar Teaspoon -- Pharyngeal -- Pharyngeal- Nectar Cup Reduced airway/laryngeal closure Pharyngeal -- Pharyngeal- Nectar Straw Reduced airway/laryngeal closure;Penetration/Aspiration before  swallow Pharyngeal Material enters airway, passes BELOW cords and not ejected out despite cough attempt by patient Pharyngeal- Thin Teaspoon -- Pharyngeal -- Pharyngeal- Thin Cup Delayed swallow initiation-pyriform sinuses;Reduced airway/laryngeal closure;Penetration/Aspiration during swallow Pharyngeal Material enters airway, passes BELOW cords without attempt by patient to eject out (silent aspiration) Pharyngeal- Thin Straw Delayed swallow initiation-pyriform sinuses;Reduced airway/laryngeal closure;Penetration/Aspiration during swallow Pharyngeal Material enters airway, passes BELOW cords without attempt by patient to eject out (silent aspiration) Pharyngeal- Puree Reduced airway/laryngeal closure Pharyngeal -- Pharyngeal- Mechanical Soft Reduced airway/laryngeal closure Pharyngeal -- Pharyngeal- Regular -- Pharyngeal -- Pharyngeal- Multi-consistency -- Pharyngeal -- Pharyngeal- Pill -- Pharyngeal -- Pharyngeal Comment --  CHL IP CERVICAL ESOPHAGEAL PHASE 09/06/2020 Cervical Esophageal Phase WFL Pudding Teaspoon -- Pudding Cup -- Honey Teaspoon -- Honey Cup -- Nectar Teaspoon -- Nectar Cup -- Nectar Straw -- Thin Teaspoon -- Thin Cup -- Thin Straw -- Puree -- Mechanical Soft -- Regular -- Multi-consistency -- Pill -- Cervical Esophageal Comment -- Osie Bond., M.A. CCC-SLP Acute Rehabilitation Services Pager [phone] Office [phone] 09/06/2020, 11:21 AM              DG ESOPHAGUS W SINGLE CM (SOL OR THIN BA)  Result Date: 09/06/2020 CLINICAL DATA:  Dysphagia. Aspiration on recent swallowing function test. EXAM: ESOPHOGRAM/BARIUM SWALLOW TECHNIQUE: Single contrast examination was performed using thin barium mixed with puree. FLUOROSCOPY TIME:  Fluoroscopy Time:  2 minutes 0 seconds Radiation Exposure Index (if provided by the fluoroscopic device): 5.6 mGy Number of Acquired Spot Images: 0 COMPARISON:  None. FINDINGS: Exam was limited by patient physical condition. Exam was performed in the LPO position using  only a small amount of thin barium mixed with puree, as this consistency did not show aspiration on the recent swallowing function test with speech pathology. There is no evidence of esophageal obstruction. No evidence of esophageal stricture or mass. Moderate to severe esophageal dysmotility is seen with lack of primary peristalsis resulting in esophageal stasis. No evidence of hiatal hernia. No gastroesophageal reflux was seen. IMPRESSION: Technically limited exam. Severe esophageal dysmotility. No evidence of esophageal stricture or mass. Electronically Signed   By: Marlaine Hind M.D.   On: 09/06/2020 12:52     Medications:     Scheduled Medications: . aspirin EC  81 mg Oral Daily  . busPIRone  15 mg Oral Daily  . Chlorhexidine Gluconate Cloth  6 each Topical Daily  . ezetimibe  10 mg Oral Daily  . guaiFENesin-dextromethorphan  5 mL Oral QID  . heparin injection (subcutaneous)  5,000 Units Subcutaneous Q8H  . [START ON 09/09/2020] levofloxacin  500 mg Oral Q48H  . sodium chloride flush  10-40 mL Intracatheter Q12H  . sodium chloride flush  3 mL Intravenous Q12H  . ticagrelor  90 mg Oral BID  . umeclidinium bromide  1 puff Inhalation Daily    Infusions: . sodium chloride Stopped (08/30/20 0817)  . sodium chloride    . sodium chloride 10 mL/hr at 09/06/20 0600  PRN Medications: sodium chloride, acetaminophen, albuterol, clonazePAM, docusate sodium, loperamide, nitroGLYCERIN, ondansetron (ZOFRAN) IV, oxyCODONE, Resource ThickenUp Clear, sodium chloride flush, sodium chloride flush   Assessment/Plan:   1. Hypotension - unclear etiology. ? Volume depletion/low output vs early sepsis.  - NE now off.  - Continue to hold diuretics.  - Tx for RLL PNA. Blood CX NGTD; UCx grew > 100K providencia and 80K proteus, susceptibility pending. Was changed from cefepime to Augmentin but in the setting of providencia, abx was change to Levaquin.  - Positive orthostatic BP with PT, will give small  IVF bolus. Monitor closely for increase WOB or shortness of breath.    2. CAD with acute anterior STEMI  - hstrop > 27K - Cardiac cath showed proximal to mid total occlusion of LAD treated with DES. Eccentric 50% stenosis proximal to the stent but no evidence of dissection. Very tortuous RCA supplying collaterals to mid and apical LAD. 70-80% stenosis of mid RCA beyond region of significant tortuosity.  - no s/s angina - continue DAPT. Statin intolerant. On Zetia  - off b-blocker with labile BP and low cardiac output  3. Acute systolic HF due to ICM - Echo on presentation showed LVEF of 35-40% with grade IDD, and wall motion abnormalities. Repeat limited echo 1/3 EF 60-65%.   - EF much better than I would have suspected given cath results, slow troponin washout and persistent ST elevation of ECG. - Volume low. Off diuretics - No spiro/ACEi/ARB in setting of AKI - Hold home lopressor due to labile BP and low cardiac output  4. AKI - Baseline Cr around 1 Creatinine stable 1.5-1.6 since admit - Will need to watch volume status closely with nectar thicken liquids - Give LR 500 mL over 4 hours.  5. COPD - stable, steroids were stopped.  - Continue inhalers daily  6. Anxiety - continue buspar and klonopin  7. Dysphagia -hx of CEA 5-6 years ago, since has had problems swallowing -Speech and swallow evaluation: esophagram noted severe esophageal dysmotility and silent aspiration with poor cough mechanics   8. DNR/DNI -Consult palliative care, appreciate recommendations.    Length of Stay: [place] NP 09/08/2020, 6:30 AM  Advanced Heart Failure Team Pager [phone] (M-F; 7a - 4p)  Please contact [facility] for night-coverage after hours (4p -7a ) and weekends on [email or web address]  Patient seen and examined with the above-signed Advanced Practice Provider and/or Housestaff. I personally reviewed laboratory data, imaging studies and relevant notes. I independently examined the  patient and formulated the important aspects of the plan. I have edited the note to reflect any of my changes or salient points. I have personally discussed the plan with the patient and/or family.  Remains very weak and lethargic. Says he feels restless.No CP. BP soft. Creatinine elevated but stable.  General:  Elderly weak and cachetic appearing. No resp difficulty HEENT: normal Neck: supple. no JVD. Carotids 2+ bilat; no bruits. No lymphadenopathy or thryomegaly appreciated. Cor: PMI nondisplaced. Regular rate & rhythm. No rubs, gallops or murmurs. Lungs: clear Abdomen: soft, nontender, nondistended. No hepatosplenomegaly. No bruits or masses. Good bowel sounds. Extremities: no cyanosis, clubbing, rash, edema Neuro: alert & orientedx3, cranial nerves grossly intact. moves all 4 extremities w/o difficulty. Affect pleasant  He is weak and uncomfortable. Hemodynamically tenuous. He states that he is not sure he wants to be alive.   I suspect best option may be to switch to comfort care. Palliative care meeting with family to discuss today.  Glori Bickers, MD  3:01 PM

## 2020-09-08 NOTE — Plan of Care (Signed)
  Problem: Cardiovascular: Goal: Ability to achieve and maintain adequate cardiovascular perfusion will improve Outcome: Progressing   

## 2020-09-09 DIAGNOSIS — J690 Pneumonitis due to inhalation of food and vomit: Secondary | ICD-10-CM | POA: Diagnosis not present

## 2020-09-09 DIAGNOSIS — R0902 Hypoxemia: Secondary | ICD-10-CM

## 2020-09-09 DIAGNOSIS — I2102 ST elevation (STEMI) myocardial infarction involving left anterior descending coronary artery: Secondary | ICD-10-CM | POA: Diagnosis not present

## 2020-09-09 DIAGNOSIS — R1310 Dysphagia, unspecified: Secondary | ICD-10-CM | POA: Diagnosis not present

## 2020-09-09 DIAGNOSIS — I213 ST elevation (STEMI) myocardial infarction of unspecified site: Secondary | ICD-10-CM | POA: Diagnosis not present

## 2020-09-09 DIAGNOSIS — Z955 Presence of coronary angioplasty implant and graft: Secondary | ICD-10-CM | POA: Diagnosis not present

## 2020-09-09 DIAGNOSIS — F411 Generalized anxiety disorder: Secondary | ICD-10-CM

## 2020-09-09 LAB — CULTURE, BLOOD (ROUTINE X 2)
Culture: NO GROWTH
Culture: NO GROWTH
Special Requests: ADEQUATE

## 2020-09-09 LAB — BASIC METABOLIC PANEL
Anion gap: 10 (ref 5–15)
BUN: 47 mg/dL — ABNORMAL HIGH (ref 8–23)
CO2: 25 mmol/L (ref 22–32)
Calcium: 8.8 mg/dL — ABNORMAL LOW (ref 8.9–10.3)
Chloride: 106 mmol/L (ref 98–111)
Creatinine, Ser: 1.91 mg/dL — ABNORMAL HIGH (ref 0.61–1.24)
GFR, Estimated: 33 mL/min — ABNORMAL LOW (ref >60)
Glucose, Bld: 120 mg/dL — ABNORMAL HIGH (ref 70–99)
Potassium: 4.8 mmol/L (ref 3.5–5.1)
Sodium: 141 mmol/L (ref 135–145)

## 2020-09-09 MED ORDER — LIDOCAINE HCL URETHRAL/MUCOSAL 2 % EX GEL
1.0000 "application " | Freq: Once | CUTANEOUS | Status: AC
  Administered 2020-09-09: 1 via URETHRAL
  Filled 2020-09-09 (×2): qty 11

## 2020-09-09 MED ORDER — OXYCODONE HCL 5 MG PO TABS
5.0000 mg | ORAL_TABLET | ORAL | Status: DC | PRN
  Administered 2020-09-11 – 2020-09-12 (×4): 5 mg via ORAL
  Filled 2020-09-09 (×4): qty 1

## 2020-09-09 NOTE — Progress Notes (Signed)
Daily Progress Note   Patient Name: John Fields       Date: 09/09/2020 DOB: [birth year]  Age: 87 y.o. MRN#: [id number] Attending Physician: Belva Crome, MD Primary Care Physician: Patient, No Pcp Per Admit Date: 08/29/2020  Reason for Consultation/Follow-up: Establishing goals of care  Subjective/GOC: Patient awake, alert, oriented and able to participate in discussion. Appears weak and deconditioned. Breathing appears improved from yesterday. Attempting to take bites and sips of lunch. Issues with urinary retention this AM. Patient reports relief after I/O cath. Reports relief from prn klonopin for anxiety.   No family at bedside. Reviewed plan of care. Patient has no concerns or needs this afternoon.   Length of Stay: 11  Current Medications: Scheduled Meds:  . aspirin EC  81 mg Oral Daily  . busPIRone  15 mg Oral Daily  . Chlorhexidine Gluconate Cloth  6 each Topical Daily  . ezetimibe  10 mg Oral Daily  . guaiFENesin-dextromethorphan  5 mL Oral QID  . heparin injection (subcutaneous)  5,000 Units Subcutaneous Q8H  . levofloxacin  500 mg Oral Q48H  . lidocaine  1 application Urethral Once  . sodium chloride flush  10-40 mL Intracatheter Q12H  . sodium chloride flush  3 mL Intravenous Q12H  . ticagrelor  90 mg Oral BID  . umeclidinium bromide  1 puff Inhalation Daily    Continuous Infusions: . sodium chloride Stopped (08/30/20 0817)  . sodium chloride    . sodium chloride 10 mL/hr at 09/06/20 0600    PRN Meds: sodium chloride, acetaminophen, albuterol, alum & mag hydroxide-simeth, clonazePAM, docusate sodium, loperamide, nitroGLYCERIN, ondansetron (ZOFRAN) IV, oxyCODONE, Resource ThickenUp Clear, sodium chloride flush, sodium chloride flush  Physical Exam Vitals and  nursing note reviewed.  Constitutional:      General: He is awake.     Appearance: He is ill-appearing.  HENT:     Head: Normocephalic and atraumatic.  Cardiovascular:     Rate and Rhythm: Normal rate.  Pulmonary:     Effort: No tachypnea, accessory muscle usage or respiratory distress.     Comments: 3L [place] Skin:    General: Skin is warm and dry.  Neurological:     Mental Status: He is alert and oriented to person, place, and time.  Psychiatric:        Mood and  Affect: Mood is anxious.        Speech: Speech normal.        Behavior: Behavior normal.        Cognition and Memory: Cognition normal.            Vital Signs: BP (!) 153/87 (BP Location: Left Arm)   Pulse 100   Temp 97.6 F (36.4 C) (Oral)   Resp (!) 24   Ht 5\' 7"  (1.702 m)   Wt 52.8 kg   SpO2 100%   BMI 18.23 kg/m  SpO2: SpO2: 100 % O2 Device: O2 Device: Nasal Cannula O2 Flow Rate: O2 Flow Rate (L/min): 3 L/min  Intake/output summary:   Intake/Output Summary (Last 24 hours) at 09/09/2020 1506 Last data filed at 09/09/2020 1019 Gross per 24 hour  Intake 30 ml  Output 1075 ml  Net -1045 ml   LBM: Last BM Date: 09/07/20 Baseline Weight: Weight: 53.1 kg Most recent weight: Weight: 52.8 kg       Palliative Assessment/Data: PPS 40%      Patient Active Problem List   Diagnosis Date Noted  . Protein-calorie malnutrition, severe 08/31/2020  . STEMI (ST elevation myocardial infarction) ([place]) 08/29/2020    Palliative Care Assessment & Plan   Patient Profile: 87 y.o. male  with past medical history of hypertension, hyperlipidemia, CAD, PVD, previous abdominal aortic aneurysm repair, prostate cancer, anxiety admitted on 08/29/2020 with chest discomfort and shortness of breath. Noted to have STEMI and brief cardiac arrest in ED. S/p PCI of LAD. During hospitalization, developed hypotension requiring transfer to ICU for pressors. Patient with dysphagia and aspiration. MBS reveals severe esophageal dysmotility,  silent aspiration. Per SLP patient is at risk for aspiration both prandially and post-prandially given pharyngeal and esophageal dysphagia. Started on dysphagia 2, nectar thick liquids. Palliative medicine consultation for goals of care.   Assessment: CAD STEMI s/p PCI Acute systolic heart failure ICM AKI COPD Dysphagia Esophageal dysmotility Anxiety Aspiration risk  Recommendations/Plan:   Copy of patient's living will placed in paper chart to be scanned into EMR.  MOST form completed with patient and daughter on 09/08/20. Patient's decisions include: DNR/DNI, NO CPAP/BiPAP, limited additional interventions including rehospitalization for IVF/ABX/cardiac monitoring if indicated, and NO feeding tube. Durable DNR completed. Copies placed in chart and given to daughter. Unable to complete electronic [employer] MOST form due to system issue.  Patient/daughter planning for SNF rehab on discharge. Agreeable for outpatient palliative f/u at SNF. Briefly introduced hospice philosophy in the near future if he continues to decline at [facility] and/or ready for comfort pathway and preventing recurrent hospitalizations.   Continue current diet and aspiration precautions. Patient would never want a feeding tube. Comfort feeds.   TOC for outpatient palliative at SNF.  Continue prn Klonopin for anxiety and prn oxycodone for pain/dyspnea/air hunger.  Code Status: DNR/DNI   Code Status Orders  (From admission, onward)         Start     Ordered   09/05/20 2321  Do not attempt resuscitation (DNR)  Continuous       Question Answer Comment  In the event of cardiac or respiratory ARREST Do not call a "code blue"   In the event of cardiac or respiratory ARREST Do not perform Intubation, CPR, defibrillation or ACLS   In the event of cardiac or respiratory ARREST Use medication by any route, position, wound care, and other measures to relive pain and suffering. May use oxygen, suction and manual treatment of  airway  obstruction as needed for comfort.      09/05/20 2321        Code Status History    Date Active Date Inactive Code Status Order ID Comments User Context   09/04/2020 1459 09/05/2020 2321 Partial Code [id number]  Sanda Klein, MD Inpatient   08/29/2020 1219 09/04/2020 1459 Full Code [id number]  Kipp Brood, MD Inpatient   Advance Care Planning Activity       Prognosis:  Poor long-term  Discharge Planning:  [place] for rehab with Palliative care service follow-up  Care plan was discussed with RN, patient  Thank you for allowing the Palliative Medicine Team to assist in the care of this patient.   Total Time 20 Prolonged Time Billed  no    Greater than 50% of this time was spent counseling and coordinating care related to the above assessment and plan.  Ihor Dow, DNP, FNP-C Palliative Medicine Team  Phone: [phone] Fax: [phone]  Please contact Palliative Medicine Team phone at [phone] for questions and concerns.

## 2020-09-09 NOTE — Progress Notes (Signed)
Progress Note  Patient Name: John Fields Date of Encounter: 09/09/2020  [facility] Cardiologist: Kirk Ruths, MD    Subjective   87 year old gentleman with a history of coronary artery disease.  He presented with an acute anterior wall ST segment elevation myocardial infarction.  He is had progressive failure to thrive.  He has had hypotension and aspiration pneumonia complications.  He has had urinary retention and required an in and out catheterization this morning.   Inpatient Medications    Scheduled Meds: . aspirin EC  81 mg Oral Daily  . busPIRone  15 mg Oral Daily  . Chlorhexidine Gluconate Cloth  6 each Topical Daily  . ezetimibe  10 mg Oral Daily  . guaiFENesin-dextromethorphan  5 mL Oral QID  . heparin injection (subcutaneous)  5,000 Units Subcutaneous Q8H  . levofloxacin  500 mg Oral Q48H  . sodium chloride flush  10-40 mL Intracatheter Q12H  . sodium chloride flush  3 mL Intravenous Q12H  . ticagrelor  90 mg Oral BID  . umeclidinium bromide  1 puff Inhalation Daily   Continuous Infusions: . sodium chloride Stopped (08/30/20 0817)  . sodium chloride    . sodium chloride 10 mL/hr at 09/06/20 0600   PRN Meds: sodium chloride, acetaminophen, albuterol, alum & mag hydroxide-simeth, clonazePAM, docusate sodium, loperamide, nitroGLYCERIN, ondansetron (ZOFRAN) IV, oxyCODONE, Resource ThickenUp Clear, sodium chloride flush, sodium chloride flush   Vital Signs    Vitals:   09/09/20 0725 09/09/20 0746 09/09/20 1039 09/09/20 1158  BP:  125/64 (!) 153/87   Pulse:  (!) 102 90 100  Resp: 20 16 16  (!) 24  Temp:  97.7 F (36.5 C) 97.6 F (36.4 C)   TempSrc:  Oral Oral   SpO2: 100% 96% 100% 100%  Weight:      Height:        Intake/Output Summary (Last 24 hours) at 09/09/2020 1303 Last data filed at 09/09/2020 1019 Gross per 24 hour  Intake 30 ml  Output 1675 ml  Net -1645 ml   Last 3 Weights 09/09/2020 09/08/2020 09/07/2020  Weight (lbs) 116 lb 6.5 oz 115 lb 8.3 oz  119 lb 0.8 oz  Weight (kg) 52.8 kg 52.4 kg 54 kg      Telemetry    NSR  - Personally Reviewed  ECG     NSR  - Personally Reviewed  Physical Exam   GEN:  chronically ill appearing man.    Neck: No JVD Cardiac: RRR,  Respiratory: greatly reduced breath sounds right  lung  GI: Soft, nontender, non-distended  MS: No edema; No deformity. Neuro:  Nonfocal  Psych: Normal affect   Labs    High Sensitivity Troponin:   Recent Labs  Lab 08/29/20 1043 08/29/20 1415 08/29/20 1634 08/29/20 1835  TROPONINIHS 12,408* >27,000* >27,000* >27,000*      Chemistry Recent Labs  Lab 09/07/20 0341 09/08/20 0512 09/09/20 0040  NA 137 140 141  K 4.7 4.9 4.8  CL 104 107 106  CO2 25 26 25   GLUCOSE 100* 95 120*  BUN 59* 49* 47*  CREATININE 1.65* 1.68* 1.91*  CALCIUM 8.5* 8.7* 8.8*  GFRNONAA 40* 39* 33*  ANIONGAP 8 7 10      Hematology Recent Labs  Lab 09/06/20 0446 09/07/20 0341 09/08/20 0512  WBC 10.1 11.7* 9.4  RBC 3.15* 3.34* 3.05*  HGB 9.8* 10.3* 9.2*  HCT 29.4* 31.3* 29.7*  MCV 93.3 93.7 97.4  MCH 31.1 30.8 30.2  MCHC 33.3 32.9 31.0  RDW 14.3  14.4 14.5  PLT 182 207 185    BNPNo results for input(s): BNP, PROBNP in the last 168 hours.   DDimer No results for input(s): DDIMER in the last 168 hours.   Radiology    No results found.  Cardiac Studies      Patient Profile     87 y.o. male with ant STEMI, chronic aspiration pneumonia   Assessment & Plan    1.  Coronary artery disease: The patient had DES to his mid LAD.  He is not having any further episodes of angina  2.  Aspiration pneumonia: He has greatly diminished breath sounds on his right lung field. Cont abx.   3.  Acute kidney injury   4.  Acute systolic CHF:   BP has been low.  Is better today    For questions or updates, please contact [place] Please consult [email or web address] for contact info under        Signed, Mertie Moores, MD  09/09/2020, 1:03 PM

## 2020-09-09 NOTE — Progress Notes (Signed)
Pt unable to void, about 600cc on bladder scan, discussed with Dr. Alyson Ingles and will ask [place] team to place cath.  Order placed.

## 2020-09-09 NOTE — Progress Notes (Signed)
Treated patient's complain of lower abdominal pain twice. Bladder area is tender to touch. Patient voided 345ml for 12 hour shift. Bladder  Scan volume was 340. In and out cath yielded 623mls. Beginning to feel comfortable now

## 2020-09-09 NOTE — Plan of Care (Signed)
Problem: Education: Goal: Understanding of CV disease, CV risk reduction, and recovery process will improve 09/09/2020 0733 by Shanon Ace, RN Outcome: Not Progressing 09/08/2020 2006 by Shanon Ace, RN Outcome: Progressing Goal: Individualized Educational Video(s) 09/09/2020 0733 by Shanon Ace, RN Outcome: Not Progressing 09/08/2020 2006 by Shanon Ace, RN Outcome: Progressing   Problem: Activity: Goal: Ability to return to baseline activity level will improve 09/09/2020 0733 by Shanon Ace, RN Outcome: Not Progressing 09/08/2020 2006 by Shanon Ace, RN Outcome: Progressing   Problem: Cardiovascular: Goal: Ability to achieve and maintain adequate cardiovascular perfusion will improve 09/09/2020 0733 by Shanon Ace, RN Outcome: Not Progressing 09/08/2020 2006 by Shanon Ace, RN Outcome: Progressing Goal: Vascular access site(s) Level 0-1 will be maintained 09/09/2020 0733 by Shanon Ace, RN Outcome: Not Progressing 09/08/2020 2006 by Shanon Ace, RN Outcome: Progressing   Problem: Health Behavior/Discharge Planning: Goal: Ability to safely manage health-related needs after discharge will improve 09/09/2020 0733 by Shanon Ace, RN Outcome: Not Progressing 09/08/2020 2006 by Shanon Ace, RN Outcome: Progressing   Problem: Education: Goal: Knowledge of General Education information will improve Description: Including pain rating scale, medication(s)/side effects and non-pharmacologic comfort measures 09/09/2020 0733 by Shanon Ace, RN Outcome: Not Progressing 09/08/2020 2006 by Shanon Ace, RN Outcome: Progressing   Problem: Health Behavior/Discharge Planning: Goal: Ability to manage health-related needs will improve 09/09/2020 0733 by Shanon Ace, RN Outcome: Not Progressing 09/08/2020 2006 by Shanon Ace, RN Outcome: Progressing   Problem: Clinical Measurements: Goal: Ability to maintain clinical measurements within normal limits will improve 09/09/2020 0733 by  Shanon Ace, RN Outcome: Not Progressing 09/08/2020 2006 by Shanon Ace, RN Outcome: Progressing Goal: Will remain free from infection 09/09/2020 0733 by Shanon Ace, RN Outcome: Not Progressing 09/08/2020 2006 by Shanon Ace, RN Outcome: Progressing Goal: Diagnostic test results will improve 09/09/2020 0733 by Shanon Ace, RN Outcome: Not Progressing 09/08/2020 2006 by Shanon Ace, RN Outcome: Progressing Goal: Respiratory complications will improve 09/09/2020 0733 by Shanon Ace, RN Outcome: Not Progressing 09/08/2020 2006 by Shanon Ace, RN Outcome: Progressing Goal: Cardiovascular complication will be avoided 09/09/2020 0733 by Shanon Ace, RN Outcome: Not Progressing 09/08/2020 2006 by Shanon Ace, RN Outcome: Progressing   Problem: Activity: Goal: Risk for activity intolerance will decrease 09/09/2020 0733 by Shanon Ace, RN Outcome: Not Progressing 09/08/2020 2006 by Shanon Ace, RN Outcome: Progressing   Problem: Nutrition: Goal: Adequate nutrition will be maintained 09/09/2020 0733 by Shanon Ace, RN Outcome: Not Progressing 09/08/2020 2006 by Shanon Ace, RN Outcome: Progressing   Problem: Coping: Goal: Level of anxiety will decrease 09/09/2020 0733 by Shanon Ace, RN Outcome: Not Progressing 09/08/2020 2006 by Shanon Ace, RN Outcome: Progressing   Problem: Elimination: Goal: Will not experience complications related to bowel motility 09/09/2020 0733 by Shanon Ace, RN Outcome: Not Progressing 09/08/2020 2006 by Shanon Ace, RN Outcome: Progressing Goal: Will not experience complications related to urinary retention 09/09/2020 0733 by Shanon Ace, RN Outcome: Not Progressing 09/08/2020 2006 by Shanon Ace, RN Outcome: Progressing   Problem: Pain Managment: Goal: General experience of comfort will improve 09/09/2020 0733 by Shanon Ace, RN Outcome: Not Progressing 09/08/2020 2006 by Shanon Ace, RN Outcome: Progressing   Problem:  Safety: Goal: Ability to remain free from injury will improve 09/09/2020 0733 by Shanon Ace, RN Outcome: Not Progressing 09/08/2020 2006 by Shanon Ace, RN Outcome: Progressing   Problem: Skin Integrity: Goal: Risk for impaired skin integrity will decrease 09/09/2020 0733 by Shanon Ace, RN Outcome: Not Progressing 09/08/2020 2006 by Shanon Ace, RN Outcome: Progressing

## 2020-09-10 DIAGNOSIS — E43 Unspecified severe protein-calorie malnutrition: Secondary | ICD-10-CM | POA: Diagnosis not present

## 2020-09-10 DIAGNOSIS — I2102 ST elevation (STEMI) myocardial infarction involving left anterior descending coronary artery: Secondary | ICD-10-CM | POA: Diagnosis not present

## 2020-09-10 DIAGNOSIS — L8990 Pressure ulcer of unspecified site, unspecified stage: Secondary | ICD-10-CM | POA: Insufficient documentation

## 2020-09-10 LAB — GLUCOSE, CAPILLARY: Glucose-Capillary: 101 mg/dL — ABNORMAL HIGH (ref 70–99)

## 2020-09-10 NOTE — Progress Notes (Signed)
Hydrologist [facility]  Hospital Liaison: RN note       Notified by [facility] manager of patient/family request for [facility] Palliative services at [facility] for rehab after discharge.       Writer spoke with daughter Baker Janus to confirm interest and explain services.             [place] Palliative team will follow up with patient after discharge.       Please call with any hospice or palliative related questions.       Thank you for this referral.       Clementeen Hoof, BSN, RN [facility] Liaison (listed on [place] under [facility])   [phone]

## 2020-09-10 NOTE — TOC Progression Note (Signed)
Transition of Care [facility] - Progression Note    Patient Details  Name: John Fields MRN: [id number] Date of Birth: 1930-07-29  Transition of Care [facility]) CM/SW [place], [place] Phone Number: 09/10/2020, 8:35 AM  Clinical Narrative:     CSW received consult for outpatient palliative to follow patient at SNF. CSW spoke with Lenna Sciara with [facility] to make referral for outpatient palliative to follow patient at SNF.  CSW will continue to follow.    Expected Discharge Plan: Skilled Nursing Facility Barriers to Discharge: Continued Medical Work up,SNF Pending bed offer,Insurance Authorization  Expected Discharge Plan and Services Expected Discharge Plan: [place]                                               Social Determinants of Health (SDOH) Interventions    Readmission Risk Interventions No flowsheet data found.

## 2020-09-10 NOTE — Plan of Care (Signed)

## 2020-09-10 NOTE — Progress Notes (Signed)
Progress Note  Patient Name: John Fields Date of Encounter: 09/10/2020  [facility] Cardiologist: Kirk Ruths, MD    Subjective   87 year old gentleman with a history of coronary artery disease.  He presented with an acute anterior wall ST segment elevation myocardial infarction.  He is had progressive failure to thrive.  He has had hypotension and aspiration pneumonia complications.   im not sure he is able to communicate.  Did not have any significant back and forth communiction with me today   Inpatient Medications    Scheduled Meds: . aspirin EC  81 mg Oral Daily  . busPIRone  15 mg Oral Daily  . Chlorhexidine Gluconate Cloth  6 each Topical Daily  . ezetimibe  10 mg Oral Daily  . guaiFENesin-dextromethorphan  5 mL Oral QID  . heparin injection (subcutaneous)  5,000 Units Subcutaneous Q8H  . levofloxacin  500 mg Oral Q48H  . sodium chloride flush  10-40 mL Intracatheter Q12H  . sodium chloride flush  3 mL Intravenous Q12H  . ticagrelor  90 mg Oral BID  . umeclidinium bromide  1 puff Inhalation Daily   Continuous Infusions: . sodium chloride Stopped (08/30/20 0817)  . sodium chloride    . sodium chloride 10 mL/hr at 09/06/20 0600   PRN Meds: sodium chloride, acetaminophen, albuterol, alum & mag hydroxide-simeth, clonazePAM, docusate sodium, loperamide, nitroGLYCERIN, ondansetron (ZOFRAN) IV, oxyCODONE, Resource ThickenUp Clear, sodium chloride flush, sodium chloride flush   Vital Signs    Vitals:   09/10/20 0744 09/10/20 1034 09/10/20 1041 09/10/20 1100  BP:  (!) 145/61    Pulse: (!) 108 (!) 102 97 97  Resp: 20 (!) 25 19 (!) 21  Temp:  97.6 F (36.4 C)    TempSrc:  Oral    SpO2:  99% 98% 100%  Weight:      Height:        Intake/Output Summary (Last 24 hours) at 09/10/2020 1156 Last data filed at 09/10/2020 1002 Gross per 24 hour  Intake 30 ml  Output 850 ml  Net -820 ml   Last 3 Weights 09/10/2020 09/09/2020 09/08/2020  Weight (lbs) 116 lb 2.9 oz 116 lb 6.5  oz 115 lb 8.3 oz  Weight (kg) 52.7 kg 52.8 kg 52.4 kg      Telemetry    NSR  - Personally Reviewed  ECG     NSR  - Personally Reviewed  Physical Exam   GEN:  chronically ill appearing man  Neck: No JVD Cardiac: RR  Respiratory: markedly reduced breath sound on left side  GI: soft, + BS  MS: no edema  Neuro:  Nonfocal  Psych: difficult to assess.    Labs    High Sensitivity Troponin:   Recent Labs  Lab 08/29/20 1043 08/29/20 1415 08/29/20 1634 08/29/20 1835  TROPONINIHS 12,408* >27,000* >27,000* >27,000*      Chemistry Recent Labs  Lab 09/07/20 0341 09/08/20 0512 09/09/20 0040  NA 137 140 141  K 4.7 4.9 4.8  CL 104 107 106  CO2 25 26 25   GLUCOSE 100* 95 120*  BUN 59* 49* 47*  CREATININE 1.65* 1.68* 1.91*  CALCIUM 8.5* 8.7* 8.8*  GFRNONAA 40* 39* 33*  ANIONGAP 8 7 10      Hematology Recent Labs  Lab 09/06/20 0446 09/07/20 0341 09/08/20 0512  WBC 10.1 11.7* 9.4  RBC 3.15* 3.34* 3.05*  HGB 9.8* 10.3* 9.2*  HCT 29.4* 31.3* 29.7*  MCV 93.3 93.7 97.4  MCH 31.1 30.8 30.2  MCHC  33.3 32.9 31.0  RDW 14.3 14.4 14.5  PLT 182 207 185    BNPNo results for input(s): BNP, PROBNP in the last 168 hours.   DDimer No results for input(s): DDIMER in the last 168 hours.   Radiology    No results found.  Cardiac Studies      Patient Profile     87 y.o. male with ant STEMI, chronic aspiration pneumonia   Assessment & Plan    1.  Coronary artery disease: The patient had DES to his mid LAD.  He is not having any further episodes of angina  2.  Aspiration pneumonia: He has greatly diminished breath sounds on his right lung field. He chronically aspirates.   3.  Acute kidney injury   4.  Acute systolic CHF:    Cont meds.    For questions or updates, please contact [place] Please consult [email or web address] for contact info under        Signed, Mertie Moores, MD  09/10/2020, 11:56 AM

## 2020-09-11 DIAGNOSIS — I2102 ST elevation (STEMI) myocardial infarction involving left anterior descending coronary artery: Secondary | ICD-10-CM | POA: Diagnosis not present

## 2020-09-11 NOTE — TOC Progression Note (Addendum)
Transition of Care [facility]) - Progression Note    Patient Details  Name: John Fields MRN: [id number] Date of Birth: April 04, 1930  Transition of Care [facility]) CM/SW [place],  Phone Number: 09/11/2020, 4:23 PM  Clinical Narrative:     CSW contacted [facility] to see about possible d/c tomorrow. [facility] expressed concerns about pt's appropriateness for rehab since bed availability is normally for 7-14 days of rehab.   CSW contacted pt daughter to inquire about plan for after rehab. Daughter states the plan was for her father to return to his home if he got better. CSW explains medicare, insurance, and SNF policies and discusse possible options with pt daughter. If [facility] only has short term 7-14 day bed's available pt may need to seek alternative SNF's. Daughter wants to call [facility] first.   Expected Discharge Plan: Skilled Nursing Facility Barriers to Discharge: Continued Medical Work up,SNF Pending bed offer,Insurance Authorization  Expected Discharge Plan and Services Expected Discharge Plan: [place]                                               Social Determinants of Health (SDOH) Interventions    Readmission Risk Interventions No flowsheet data found.

## 2020-09-11 NOTE — Progress Notes (Signed)
Physical Therapy Treatment Patient Details Name: John Fields MRN: [id number] DOB: Jan 05, 1930 Today's Date: 09/11/2020    History of Present Illness This 87 y.o. male admitted with late presentation anterior MI.  He suffered V-Fib arrest in the ED requiring transient CPR and defibrillation.  cardiac cath revealed occluded LAD and 70% Rt coronary artery underwent revascularizaion.   PMH includes:  PVD, s/p AAA repair, COPD    PT Comments    Pt making gradual progress but continues to fatigue very easily.  He did not have orthostatic hypotension and was able to tolerate multiple stands.  Pt had been up in chair and recently returned to bed, so was fatigued and did not feel up to ambulation away from bed.  Did note in chart , discussion in regards to d/c to SNF with outpatient palliative services.     Follow Up Recommendations  SNF;Supervision/Assistance - 24 hour     Equipment Recommendations  Rolling walker with 5" wheels;3in1 (PT)    Recommendations for Other Services       Precautions / Restrictions Precautions Precautions: Fall Precaution Comments: Dys 2 diet with nectar thick liquids.    Mobility  Bed Mobility Overal bed mobility: Needs Assistance Bed Mobility: Sidelying to Sit;Sit to Sidelying   Sidelying to sit: Min guard     Sit to sidelying: Min guard General bed mobility comments: min guard for safety  Transfers Overall transfer level: Needs assistance Equipment used: Rolling walker (2 wheeled) Transfers: Sit to/from Stand Sit to Stand: Min assist         General transfer comment: Performed x 3 with cues for hand placment ; min A to steady  Ambulation/Gait Ambulation/Gait assistance: Min Web designer (Feet): 3 Feet Assistive device: Rolling walker (2 wheeled) Gait Pattern/deviations: Step-to pattern;Decreased stride length Gait velocity: slowed   General Gait Details: 3' side steps at EOB; also marched in place 30 sec; declined ambulation from  bed due to fatigue (had already been up most of the day in chair); min A to steady and cues for posture   Stairs             Wheelchair Mobility    Modified Rankin (Stroke Patients Only)       Balance Overall balance assessment: Needs assistance Sitting-balance support: No upper extremity supported;Feet unsupported Sitting balance-Leahy Scale: Good Sitting balance - Comments: able to sit EOb without UE support and perform exercises   Standing balance support: Bilateral upper extremity supported Standing balance-Leahy Scale: Poor Standing balance comment: Required RW but steady with RW                            Cognition Arousal/Alertness: Awake/alert Behavior During Therapy: Flat affect Overall Cognitive Status: Within Functional Limits for tasks assessed                                        Exercises General Exercises - Lower Extremity Ankle Circles/Pumps: AROM;Both;10 reps;Seated Long Arc Quad: AROM;Both;Seated;10 reps Hip Flexion/Marching: AROM;Both;Seated;5 reps Other Exercises Other Exercises: Shoulder press bil x 10    General Comments General comments (skin integrity, edema, etc.): Pt's BP was stable 130's/70's in sitting and standing; He was on 3 L O2 with sats >93%; HR stable      Pertinent Vitals/Pain Pain Assessment: No/denies pain Faces Pain Scale: No hurt    Home Living  Prior Function            PT Goals (current goals can now be found in the care plan section) Acute Rehab PT Goals Patient Stated Goal: to breathe better PT Goal Formulation: With patient Time For Goal Achievement: 09/15/20 Potential to Achieve Goals: Fair Progress towards PT goals: Progressing toward goals    Frequency    Min 2X/week      PT Plan Frequency needs to be updated    Co-evaluation              AM-PAC PT "6 Clicks" Mobility   Outcome Measure  Help needed turning from your back to  your side while in a flat bed without using bedrails?: A Little Help needed moving from lying on your back to sitting on the side of a flat bed without using bedrails?: A Little Help needed moving to and from a bed to a chair (including a wheelchair)?: A Little Help needed standing up from a chair using your arms (e.g., wheelchair or bedside chair)?: A Little Help needed to walk in hospital room?: A Little Help needed climbing 3-5 steps with a railing? : A Lot 6 Click Score: 17    End of Session Equipment Utilized During Treatment: Gait belt;Oxygen Activity Tolerance: Patient limited by fatigue Patient left: with call bell/phone within reach;in bed;with bed alarm set Nurse Communication: Mobility status PT Visit Diagnosis: Other abnormalities of gait and mobility (R26.89);Difficulty in walking, not elsewhere classified (R26.2);Muscle weakness (generalized) (M62.81)     Time: 0623-7628 PT Time Calculation (min) (ACUTE ONLY): 19 min  Charges:  $Therapeutic Exercise: 8-22 mins                     Abran Richard, PT Acute Rehab Services Pager [phone] [facility] [employer] 09/11/2020, 3:50 PM

## 2020-09-11 NOTE — Plan of Care (Signed)
  Problem: Education: Goal: Understanding of CV disease, CV risk reduction, and recovery process will improve Outcome: Progressing Goal: Individualized Educational Video(s) Outcome: Progressing   Problem: Activity: Goal: Ability to return to baseline activity level will improve Outcome: Progressing   Problem: Cardiovascular: Goal: Ability to achieve and maintain adequate cardiovascular perfusion will improve Outcome: Progressing Goal: Vascular access site(s) Level 0-1 will be maintained Outcome: Progressing   Problem: Health Behavior/Discharge Planning: Goal: Ability to safely manage health-related needs after discharge will improve Outcome: Progressing   Problem: Education: Goal: Knowledge of General Education information will improve Description: Including pain rating scale, medication(s)/side effects and non-pharmacologic comfort measures Outcome: Progressing   Problem: Health Behavior/Discharge Planning: Goal: Ability to manage health-related needs will improve Outcome: Progressing   Problem: Clinical Measurements: Goal: Ability to maintain clinical measurements within normal limits will improve Outcome: Progressing Goal: Will remain free from infection Outcome: Progressing Goal: Diagnostic test results will improve Outcome: Progressing Goal: Respiratory complications will improve Outcome: Progressing Goal: Cardiovascular complication will be avoided Outcome: Progressing   Problem: Activity: Goal: Risk for activity intolerance will decrease Outcome: Progressing   Problem: Nutrition: Goal: Adequate nutrition will be maintained Outcome: Progressing   Problem: Coping: Goal: Level of anxiety will decrease Outcome: Progressing   Problem: Coping: Goal: Level of anxiety will decrease Outcome: Progressing   Problem: Elimination: Goal: Will not experience complications related to bowel motility Outcome: Progressing Goal: Will not experience complications related  to urinary retention Outcome: Progressing   Problem: Pain Managment: Goal: General experience of comfort will improve Outcome: Progressing   Problem: Safety: Goal: Ability to remain free from injury will improve Outcome: Progressing   Problem: Skin Integrity: Goal: Risk for impaired skin integrity will decrease Outcome: Progressing

## 2020-09-11 NOTE — Progress Notes (Signed)
Progress Note  Patient Name: John Fields Date of Encounter: 09/11/2020  [facility] Cardiologist: Kirk Ruths, MD   Subjective   No chest pain; mild dyspnea  Inpatient Medications    Scheduled Meds:  aspirin EC  81 mg Oral Daily   busPIRone  15 mg Oral Daily   Chlorhexidine Gluconate Cloth  6 each Topical Daily   ezetimibe  10 mg Oral Daily   guaiFENesin-dextromethorphan  5 mL Oral QID   heparin injection (subcutaneous)  5,000 Units Subcutaneous Q8H   levofloxacin  500 mg Oral Q48H   sodium chloride flush  10-40 mL Intracatheter Q12H   sodium chloride flush  3 mL Intravenous Q12H   ticagrelor  90 mg Oral BID   umeclidinium bromide  1 puff Inhalation Daily   Continuous Infusions:  sodium chloride Stopped (08/30/20 0817)   sodium chloride     sodium chloride 10 mL/hr at 09/06/20 0600   PRN Meds: sodium chloride, acetaminophen, albuterol, alum & mag hydroxide-simeth, clonazePAM, docusate sodium, loperamide, nitroGLYCERIN, ondansetron (ZOFRAN) IV, oxyCODONE, Resource ThickenUp Clear, sodium chloride flush, sodium chloride flush   Vital Signs    Vitals:   09/11/20 0000 09/11/20 0352 09/11/20 0400 09/11/20 0759  BP: 127/64 126/63 123/66 (!) 107/52  Pulse: 96 98 93 100  Resp: (!) 24 20 20 20   Temp: 97.9 F (36.6 C) 97.9 F (36.6 C)  98 F (36.7 C)  TempSrc: Oral Oral  Oral  SpO2: 100% 99% 99% 100%  Weight:  50.6 kg    Height:        Intake/Output Summary (Last 24 hours) at 09/11/2020 0808 Last data filed at 09/10/2020 1818 Gross per 24 hour  Intake 30 ml  Output --  Net 30 ml   Last 3 Weights 09/11/2020 09/10/2020 09/09/2020  Weight (lbs) 111 lb 8.8 oz 116 lb 2.9 oz 116 lb 6.5 oz  Weight (kg) 50.6 kg 52.7 kg 52.8 kg      Telemetry    Sinus with PVC- Personally Reviewed  Physical Exam   GEN: No acute distress.  Frail Neck: No JVD Cardiac: RRR Respiratory: Diminished BS  GI: Soft, nontender, non-distended  MS: No edema Neuro:  Nonfocal   Psych: Normal affect   Labs    High Sensitivity Troponin:   Recent Labs  Lab 08/29/20 1043 08/29/20 1415 08/29/20 1634 08/29/20 1835  TROPONINIHS 12,408* >27,000* >27,000* >27,000*      Chemistry Recent Labs  Lab 09/07/20 0341 09/08/20 0512 09/09/20 0040  NA 137 140 141  K 4.7 4.9 4.8  CL 104 107 106  CO2 25 26 25   GLUCOSE 100* 95 120*  BUN 59* 49* 47*  CREATININE 1.65* 1.68* 1.91*  CALCIUM 8.5* 8.7* 8.8*  GFRNONAA 40* 39* 33*  ANIONGAP 8 7 10      Hematology Recent Labs  Lab 09/06/20 0446 09/07/20 0341 09/08/20 0512  WBC 10.1 11.7* 9.4  RBC 3.15* 3.34* 3.05*  HGB 9.8* 10.3* 9.2*  HCT 29.4* 31.3* 29.7*  MCV 93.3 93.7 97.4  MCH 31.1 30.8 30.2  MCHC 33.3 32.9 31.0  RDW 14.3 14.4 14.5  PLT 182 207 185    Patient Profile     87 y.o. male admitted with acute anterior myocardial infarction (late presentation). Patient suffered ventricular fibrillation arrest in the emergency room requiring transient CPR and defibrillation. Cardiac catheterization revealed occluded LAD and 70% right coronary artery. Left ventricular end-diastolic pressure 9 mmHg. Patient had PCI of the LAD. Echocardiogram showed ejection fraction 35 to 40%, grade  1 diastolic dysfunction, mild mitral regurgitation.  Patient developed profound hypotension and lactic acidosis requiring transfer to ICU January 3.  Assessment & Plan    1 coronary artery disease status post PCI of LAD-continue aspirin and Brilinta.  Intolerant to statins.  2 ischemic cardiomyopathy-we will hold on beta blockade or ARB at this point as blood pressure has been extremely labile.  We will add later if blood pressure allows.  3 acute kidney injury-we will not diurese further.  4 aspiration pneumonia-complete antibiotics.  5 no CODE BLUE status  Awaiting placement.  For questions or updates, please contact [place] Please consult [email or web address] for contact info under        Signed, Kirk Ruths, MD   09/11/2020, 8:08 AM

## 2020-09-11 NOTE — Progress Notes (Signed)
  Speech Language Pathology Treatment: Dysphagia  Patient Details Name: John Fields MRN: [id number] DOB: 07-17-1930 Today's Date: 09/11/2020 Time: 1505-6979 SLP Time Calculation (min) (ACUTE ONLY): 10 min  Assessment / Plan / Recommendation Clinical Impression  Pt was sitting upright in his chair, appearing to be a little short of breath but subjectively telling me that he believes he is breathing more comfortably than he was last week. Pt consumed bites of magic cup and sips of nectar thick supplement with a single delayed cough. SLP provided Min cues for use of esophageal precautions - particularly alternating liquids with the solids. He manages small bites/sips at a slow rate with Mod I. Pt still remains at a more chronic risk for aspiration, but per palliative care notes, pt would not want alternative means of nutrition. Therefore, would continue with current diet and precautions to mitigate risk as much as possible.   HPI HPI: Pt is an 87 yo male admitted with late presentation anterior MI. He suffered V-Fib arrest in the ED requiring transient CPR and defibrillation. Cardiac cath revealed occluded LAD and 70% Rt coronary artery s/p revascularizaion. PCCM involved for persistent dyspnea with concern for recurrent aspiration. CXR 1/3 revealed increased opacity in the RLL. PMH includes:  PVD, s/p AAA repair, COPD, reported difficulty swallowing since CEA approximately 5-6 years ago      SLP Plan  Continue with current plan of care       Recommendations  Diet recommendations: Dysphagia 2 (fine chop);Nectar-thick liquid Liquids provided via: Cup;No straw Medication Administration: Whole meds with puree Supervision: Patient able to self feed;Intermittent supervision to cue for compensatory strategies Compensations: Slow rate;Small sips/bites;Follow solids with liquid (eat smaller, more frequent meals) Postural Changes and/or Swallow Maneuvers: Seated upright 90 degrees;Upright 30-60 min  after meal                Oral Care Recommendations: Oral care BID Follow up Recommendations: Skilled Nursing facility SLP Visit Diagnosis: Dysphagia, pharyngoesophageal phase (R13.14) Plan: Continue with current plan of care       GO                Osie Bond., M.A. [place] [facility] Pager [phone] Office [phone]  09/11/2020, 12:14 PM

## 2020-09-12 DIAGNOSIS — F419 Anxiety disorder, unspecified: Secondary | ICD-10-CM

## 2020-09-12 DIAGNOSIS — I2102 ST elevation (STEMI) myocardial infarction involving left anterior descending coronary artery: Secondary | ICD-10-CM | POA: Diagnosis not present

## 2020-09-12 DIAGNOSIS — I959 Hypotension, unspecified: Secondary | ICD-10-CM

## 2020-09-12 DIAGNOSIS — Z9861 Coronary angioplasty status: Secondary | ICD-10-CM

## 2020-09-12 DIAGNOSIS — I714 Abdominal aortic aneurysm, without rupture, unspecified: Secondary | ICD-10-CM

## 2020-09-12 DIAGNOSIS — I469 Cardiac arrest, cause unspecified: Secondary | ICD-10-CM

## 2020-09-12 DIAGNOSIS — E785 Hyperlipidemia, unspecified: Secondary | ICD-10-CM

## 2020-09-12 DIAGNOSIS — R339 Retention of urine, unspecified: Secondary | ICD-10-CM

## 2020-09-12 DIAGNOSIS — J189 Pneumonia, unspecified organism: Secondary | ICD-10-CM

## 2020-09-12 DIAGNOSIS — R627 Adult failure to thrive: Secondary | ICD-10-CM

## 2020-09-12 DIAGNOSIS — R1310 Dysphagia, unspecified: Secondary | ICD-10-CM

## 2020-09-12 DIAGNOSIS — I2510 Atherosclerotic heart disease of native coronary artery without angina pectoris: Secondary | ICD-10-CM

## 2020-09-12 LAB — SARS CORONAVIRUS 2 (TAT 6-24 HRS): SARS Coronavirus 2: NEGATIVE

## 2020-09-12 MED ORDER — ALBUTEROL SULFATE HFA 108 (90 BASE) MCG/ACT IN AERS
1.0000 | INHALATION_SPRAY | RESPIRATORY_TRACT | Status: AC | PRN
Start: 2020-09-12 — End: ?

## 2020-09-12 MED ORDER — TICAGRELOR 90 MG PO TABS: 90.0000 mg | ORAL_TABLET | Freq: Two times a day (BID) | ORAL | Status: AC

## 2020-09-12 MED ORDER — NITROGLYCERIN 0.4 MG SL SUBL
0.4000 mg | SUBLINGUAL_TABLET | SUBLINGUAL | 12 refills | Status: AC | PRN
Start: 2020-09-12 — End: ?

## 2020-09-12 MED ORDER — RESOURCE THICKENUP CLEAR PO POWD: 1.0000 | ORAL | Status: AC | PRN

## 2020-09-12 MED ORDER — ALUM & MAG HYDROXIDE-SIMETH 200-200-20 MG/5ML PO SUSP
30.0000 mL | Freq: Four times a day (QID) | ORAL | 0 refills | Status: AC | PRN
Start: 2020-09-12 — End: ?

## 2020-09-12 MED ORDER — DOCUSATE SODIUM 100 MG PO CAPS: 100.0000 mg | ORAL_CAPSULE | Freq: Two times a day (BID) | ORAL | 0 refills | Status: AC | PRN

## 2020-09-12 MED ORDER — LOPERAMIDE HCL 2 MG PO CAPS: 2.0000 mg | ORAL_CAPSULE | ORAL | 0 refills | Status: AC | PRN

## 2020-09-12 MED ORDER — EZETIMIBE 10 MG PO TABS: 10.0000 mg | ORAL_TABLET | Freq: Every day | ORAL | 0 refills | Status: AC

## 2020-09-12 MED ORDER — LEVOFLOXACIN 500 MG PO TABS: 500.0000 mg | ORAL_TABLET | ORAL | Status: AC

## 2020-09-12 MED ORDER — GUAIFENESIN-DM 100-10 MG/5ML PO SYRP: 5.0000 mL | ORAL_SOLUTION | Freq: Four times a day (QID) | ORAL | 0 refills | Status: AC

## 2020-09-12 MED ORDER — UMECLIDINIUM BROMIDE 62.5 MCG/INH IN AEPB
1.0000 | INHALATION_SPRAY | Freq: Every day | RESPIRATORY_TRACT | Status: AC
Start: 2020-09-13 — End: ?

## 2020-09-12 NOTE — Discharge Summary (Signed)
Discharge Summary    Patient ID: John Fields MRN: [id number]; DOB: 04/06/1930  Admit date: 08/29/2020 Discharge date: 09/12/2020  Primary Care Provider: Patient, No Pcp Per  Primary Cardiologist: Kirk Ruths, MD   Discharge Diagnoses    Principal Problem:   STEMI (ST elevation myocardial infarction) [facility]) Active Problems:   Protein-calorie malnutrition, severe   Pressure injury of skin   PNA (pneumonia)   Anxiety   Cardiac arrest ([place])   Urinary retention   Failure to thrive in adult   Hypotension   HLD (hyperlipidemia)   AAA (abdominal aortic aneurysm) without rupture ([place])   CAD S/P percutaneous coronary angioplasty   Dysphagia  Diagnostic Studies/Procedures    LHC 08/29/20:   A stent was successfully placed.    Late presenting (greater than 30 hours) anteroapical infarction complicated by ventricular fibrillation in the emergency department after the initial evaluation.  Successful PCI of proximal to mid total occlusion of the LAD reducing the stenosis to 0% with TIMI grade III flow.  There is eccentric 50% stenosis proximal to the stent but without evidence of dissection noted.  The large 1st diagonal was not jailed.  Patent left main  Relatively small but patent circumflex.  Very tortuous right coronary supplying collaterals to the mid and apical LAD via septal perforators.  The mid RCA contains a 70 to 80% stenosis beyond region of significant tortuosity.  Anteroapical akinesis.  LVEDP 9 mmHg.  Acute systolic heart failure with normal filling pressure.  Recommendations:   Because of the late presenting nature of the patient's infarction, IV heparin will be resumed.  2D Doppler echocardiogram will be done in a.m. to assess for the presence of apical thrombus.  If none is present heparin can be discontinued.  Continue dual antiplatelet therapy.  Optimize medical therapy to achieve guideline directed therapy for systolic dysfunction  Aggressive  risk factor modification including consideration of PCSK9 therapy for lipid control  Diagnostic Dominance: Right    Intervention      _____________  Echocardiogram 08/29/20:  1. Left ventricular ejection fraction, by estimation, is 35 to 40%. The  left ventricle has moderately decreased function. The left ventricle  demonstrates regional wall motion abnormalities (see scoring  diagram/findings for description). Left ventricular  diastolic parameters are consistent with Grade I diastolic dysfunction  (impaired relaxation). Elevated left atrial pressure.  2. Right ventricular systolic function is normal. The right ventricular  size is normal. There is mildly elevated pulmonary artery systolic  pressure. The estimated right ventricular systolic pressure is [id number] mmHg.  3. The mitral valve is normal in structure. Mild mitral valve  regurgitation. No evidence of mitral stenosis.  4. The aortic valve is tricuspid. Aortic valve regurgitation is not  visualized. Mild aortic valve sclerosis is present, with no evidence of  aortic valve stenosis.  5. The inferior vena cava is normal in size with greater than 50%  respiratory variability, suggesting right atrial pressure of 3 mmHg.   FINDINGS  Left Ventricle: Left ventricular ejection fraction, by estimation, is 35  to 40%. The left ventricle has moderately decreased function. The left  ventricle demonstrates regional wall motion abnormalities. The left  ventricular internal cavity size was  normal in size. There is borderline left ventricular hypertrophy. Left  ventricular diastolic parameters are consistent with Grade I diastolic  dysfunction (impaired relaxation). Elevated left atrial pressure.     Echocardiogram 09/04/20:  There is hypokinesis of the apical anterior, apical septum and apex. LVEF  50-55%. Consider repeating limited images  with Definity contrast for  improved endocardial border definition and LVEF  assessment.    History of Present Illness     John Fields is a 87 y.o. male with a hx of HTN, HLD, CAD, PVD, EAVR s/p AAA repair 2010, prostate CA, and COPD who presented to [facility] 08/29/20 with chest pain and shortness of breath that began two days prior to presentation. John Fields reports that he woke in the morning with symptoms of indigestion at which time he took an ASA with relief. He had several episodes thereafter and was seen in an outside urgent care center who then sent him to the ED. In the ED, EKG showed ST elevation in the anterior leads. He was started on ASA, Heparin and beta blocker therapies. While waiting in the ED, he had a VF arrest requiring epi x 1, CPR for 2 minutes and defibrillation.  Hospital Course     He was then taken emergently to the cardiac cath lab 08/29/20 which showed proximal to mid total occlusion of LAD treated with DES/PCI. There was also eccentric 50% stenosis proximal to the stent however no evidence of dissection. There was tortuous RCA supplying collaterals to mid and apical LAD and a 70-80% stenosis of mid RCA beyond region of significant tortuosity. Echo performed on 12/28 revealed EF 35-40% with grade I DD and regional wall motion abnormalities. Pulmonary/Critical care was consulted due to acute hypoxic respiratory insufficiency. He was treated with supplemental oxygen and Solu-Medrol.  On 09/04/20 he began having issues with dyspnea. Repeat echo showed  moderately improved LVEF however the apex showed dyskinetic movement with some concern for the risk of free wall rupture, in which case prognosis would be grim. He was transferred to CVICU for hypotension and lactic acidosis and was subsequently started on levophed. Advanced Heart Failure team was consulted due to patients critical status. PICC line was placed. His code status was re-adressed with the patient and family who wished for DNR code status.  He eventually responded to vasopressors and IVF hydration and  was able to slowly wean off vasopressors. CXR with concern for aspiration PNA and he was therefore treated for RLL PNA with abx therapy. Urine cultures grew 100K providencia and 80K proteus and his antibiotics were adjusted accordingly.   He was eventually transferred to progressive care 09/06/20 and he began working with PT/OT. Palliative care was consulted 09/07/20 due to his profound medical issues to review goals of care. He was having issues with urinary retention that required in and out catheterizations. This slowly resolved by day of discharge. Despite being able to wean off pressor support, he had ongoing issues with hypotension and plan was to hold off on losartan and beta blocker therapies at discharge despite acute STEMI>>to be considered in the future if BP allows. He will continue on ASA and Brilinta given PCI to the LAD. He is intolerant to statin therapy. He will continue his course of antibiotics for aspiration PNA. Swallow study performed with recs for Dysphagia II diet per Speech Therapy   Below is an outline of hospital medical problems:  Acute Anterior STEMI: Troponin peak at >27 K.  LHC 08/29/20 showed proximal to mid total occlusion of LAD treated with DES. Eccentric 50% stenosis proximal to the stent but no evidence of dissection. Tortuous RCA supplying collaterals to mid and apical LAD. 70-80% stenosis of mid RCA beyond region of significant tortuosity.  -Initial echocardiogram on 08/29/20 showed LVEF of 35-40% with grade IDD, and wall motion abnormalities.  -  Repeat limited echo 1/3 EF 60-65% -Continue DAPT with Aspirin and Brilinta -Hold beta-blocker, losartan due to hypotension   Ventricular Fibrillation Cardiac Arrest: Secondary to late presenting acute anterior STEMI. Treated with IV Epinephrine 1mg  and defibrillated. Received total of 2 minutes of CPR before ROSC.  Initially started on IV Amiodarone but discontinued given arrest occurred in setting of acute MI.  -There has been  no recurrent VF or VT per telemetry   Ischemic Cardiomyopathy: Initial echo with LVEF of 35-40% with multiple wall motion abnormalities however repeat limited echo with EF 60-65%. Initially, home lisinopril discontinued and patient started on Losartan and then transitioned to [facility] but given ongoing issues with hypotension, these were held with plans to start in the OP setting. Volume status stable on day of discharge   Hypotension: He has a hx of hypertension however BP has been borderline after stabilizing in the post PCI/arrest setting. He did require levophed but this was weaned off.   AKI: Baseline Cr around 1.0, however this peaked to 1.91  Aspiration PNA/hpoxia/COPD exacerbation/dyshasia: Seen by CCM and treated with Solumedrol. CXR with concern for aspiration PNA therefore he was started on abx therapy. Swallow study performed with recs for Dysphagia II diet per Speech Therapy   Hyperlipidemia: Lipid panel this admission: Total Cholesterol 153, Triglycerides 74, HDL 45, LDL 93.  LDL goal <70 given CAD. Patient intolerant to statins. Started on Zetia 10mg  daily. Will need repeat lipid panel and LFTs in 6-12 weeks. If LDL still above goal, would consider Repatha.  PVD: History of lower extremity disease and previous carotid endarterectomy. Continue Aspirin and Zetia.  History of AAA Repair: Follow[facility] at [facility].   Thyroid Density: Noted on chest x-ray. Review on EMR showed u/s thyroid in 2016, 2019 without changes. Follow-up with PCP.  Anxiety: Continue home Buspar and lorazepam  Consultants: Advanced Heart Failure, Pulmonary Critical Care, PT/OT, Speech Therapy, Palliative Care   The patient has been seen and examined by Dr. Stanford Breed who feel that he is stable and ready for discharge today, 09/12/20  Did the patient have an acute coronary syndrome (MI, NSTEMI, STEMI, etc) this admission?:  Yes                               AHA/ACC Clinical Performance & Quality  Measures: 7. Aspirin prescribed? - Yes 8. ADP Receptor Inhibitor (Plavix/Clopidogrel, Brilinta/Ticagrelor or Effient/Prasugrel) prescribed (includes medically managed patients)? - Yes 9. Beta Blocker prescribed? - No - hypotension  10. High Intensity Statin (Lipitor 40-80mg  or Crestor 20-40mg ) prescribed? - No - statin intolerant  11. EF assessed during THIS hospitalization? - Yes 12. For EF <40%, was ACEI/ARB prescribed? - Not Applicable (EF >/= 16%) 13. For EF <40%, Aldosterone Antagonist (Spironolactone or Eplerenone) prescribed? - Not Applicable (EF >/= 10%) 14. Cardiac Rehab Phase II ordered (including medically managed patients)? - Yes   _____________  Discharge Vitals Blood pressure (!) 98/55, pulse (!) 102, temperature 97.8 F (36.6 C), temperature source Oral, resp. rate 20, height 5\' 7"  (1.702 m), weight 55.1 kg, SpO2 96 %.  Filed Weights   09/10/20 0433 09/11/20 0352 09/12/20 0342  Weight: 52.7 kg 50.6 kg 55.1 kg    Labs & Radiologic Studies    CBC No results for input(s): WBC, NEUTROABS, HGB, HCT, MCV, PLT in the last 72 hours. Basic Metabolic Panel No results for input(s): NA, K, CL, CO2, GLUCOSE, BUN, CREATININE, CALCIUM, MG, PHOS in the last  72 hours. Liver Function Tests No results for input(s): AST, ALT, ALKPHOS, BILITOT, PROT, ALBUMIN in the last 72 hours. No results for input(s): LIPASE, AMYLASE in the last 72 hours. High Sensitivity Troponin:   Recent Labs  Lab 08/29/20 1043 08/29/20 1415 08/29/20 1634 08/29/20 1835  TROPONINIHS 12,408* >27,000* >27,000* >27,000*    BNP Invalid input(s): POCBNP D-Dimer No results for input(s): DDIMER in the last 72 hours. Hemoglobin A1C No results for input(s): HGBA1C in the last 72 hours. Fasting Lipid Panel No results for input(s): CHOL, HDL, LDLCALC, TRIG, CHOLHDL, LDLDIRECT in the last 72 hours. Thyroid Function Tests No results for input(s): TSH, T4TOTAL, T3FREE, THYROIDAB in the last 72 hours.  Invalid  input(s): FREET3 _____________  CARDIAC CATHETERIZATION  Result Date: 08/29/2020  A stent was successfully placed.   Late presenting (greater than 30 hours) anteroapical infarction complicated by ventricular fibrillation in the emergency department after the initial evaluation.  Successful PCI of proximal to mid total occlusion of the LAD reducing the stenosis to 0% with TIMI grade III flow.  There is eccentric 50% stenosis proximal to the stent but without evidence of dissection noted.  The large 1st diagonal was not jailed.  Patent left main  Relatively small but patent circumflex.  Very tortuous right coronary supplying collaterals to the mid and apical LAD via septal perforators.  The mid RCA contains a 70 to 80% stenosis beyond region of significant tortuosity.  Anteroapical akinesis.  LVEDP 9 mmHg.  Acute systolic heart failure with normal filling pressure. Recommendations:  Because of the late presenting nature of the patient's infarction, IV heparin will be resumed.  2D Doppler echocardiogram will be done in a.m. to assess for the presence of apical thrombus.  If none is present heparin can be discontinued.  Continue dual antiplatelet therapy.  Optimize medical therapy to achieve guideline directed therapy for systolic dysfunction  Aggressive risk factor modification including consideration of PCSK9 therapy for lipid control   DG CHEST PORT 1 VIEW  Result Date: 09/04/2020 CLINICAL DATA:  Hypoxia.  PICC placement. EXAM: PORTABLE CHEST 1 VIEW COMPARISON:  08/30/2020 FINDINGS: New right PICC has its tip in the mid superior vena cava. Irregular interstitial opacities are bilaterally most prominently in the lung bases, right greater than left. There additional confluent opacity at the right lung base obscuring the lateral hemidiaphragm suggesting a small pleural effusion new or increased from the previous exam. No pneumothorax. Cardiac silhouette is normal in size.  No mediastinal masses.  IMPRESSION: 1. New right PICC has its tip in the mid superior vena cava. 2. Increase opacity at the right lung base suggesting a new or increased pleural effusion. Persistent bilateral interstitial lung opacities, greater in the bases, right greater than left. This may be all chronic. A component of interstitial edema or infection is possible. Electronically Signed   By: Lajean Manes M.D.   On: 09/04/2020 17:17   DG CHEST PORT 1 VIEW  Result Date: 08/30/2020 CLINICAL DATA:  Shortness of breath. EXAM: PORTABLE CHEST 1 VIEW COMPARISON:  Chest x-ray from yesterday. FINDINGS: Normal heart size. Unchanged right upper tracheal deviation due to known underlying thyroid goiter. Progressive mild diffuse interstitial thickening, most prominent at the right greater than left lung bases. Probable small right pleural effusion. No pneumothorax. Emphysema. No acute osseous abnormality. IMPRESSION: 1. New mild interstitial pulmonary edema and probable small right pleural effusion. 2. COPD. Electronically Signed   By: Titus Dubin M.D.   On: 08/30/2020 10:13   DG Chest  Port 1 View  Result Date: 08/29/2020 CLINICAL DATA:  Chest pain. EXAM: PORTABLE CHEST 1 VIEW COMPARISON:  03/17/2019. 09/10/2017. Thyroid ultrasound report 07/03/2016. CT chest 06/26/2016. FINDINGS: Large left paratracheal soft tissue density consistent with known thyroid mass. Stable cardiomegaly. No pulmonary venous congestion. Stable mild bilateral interstitial prominence consistent chronic interstitial lung disease. Prominent nipple shadow again noted on the right. No focal infiltrate. Degenerative change thoracic spine. IMPRESSION: 1. Large left paratracheal soft tissue density consistent with known thyroid mass. 2. Stable cardiomegaly. No pulmonary venous congestion. 3. Stable mild bilateral interstitial prominence consistent with chronic interstitial lung disease. No acute infiltrate. Electronically Signed   By: [place]   On: 08/29/2020  11:15   DG Swallowing Func-Speech Pathology  Result Date: 09/06/2020 Objective Swallowing Evaluation: Type of Study: MBS-Modified Barium Swallow Study  Patient Details Name: Ravion Pinchback MRN: [id number] Date of Birth: 1930/04/15 Today's Date: 09/06/2020 Time: SLP Start Time (ACUTE ONLY): [id number] -SLP Stop Time (ACUTE ONLY): 1002 SLP Time Calculation (min) (ACUTE ONLY): 24 min Past Medical History: Past Medical History: Diagnosis Date . Abdominal aortic aneurysm (AAA) ([place])  . Anxiety  . Carotid artery disease ([place])  . COPD (chronic obstructive pulmonary disease) ([place])  . Hyperlipidemia  . Hypertension  . Peripheral vascular disease ([place])  . Prostate cancer [facility])  Past Surgical History: Past Surgical History: Procedure Laterality Date . ABDOMINAL AORTIC ANEURYSM REPAIR   . BACK SURGERY   . CAROTID ENDARTERECTOMY   . Cataract surgery   . CORONARY STENT INTERVENTION N/A 08/29/2020  Procedure: CORONARY STENT INTERVENTION;  Surgeon: Belva Crome, MD;  Location: [place] CV LAB;  Service: Cardiovascular;  Laterality: N/A; . CORONARY/GRAFT ACUTE MI REVASCULARIZATION N/A 08/29/2020  Procedure: Coronary/Graft Acute MI Revascularization;  Surgeon: Belva Crome, MD;  Location: [place] CV LAB;  Service: Cardiovascular;  Laterality: N/A; . LEFT HEART CATH AND CORONARY ANGIOGRAPHY N/A 08/29/2020  Procedure: LEFT HEART CATH AND CORONARY ANGIOGRAPHY;  Surgeon: Belva Crome, MD;  Location: [place] CV LAB;  Service: Cardiovascular;  Laterality: N/A; HPI: Pt is an 87 yo male admitted with late presentation anterior MI. He suffered V-Fib arrest in the ED requiring transient CPR and defibrillation. Cardiac cath revealed occluded LAD and 70% Rt coronary artery s/p revascularizaion. PCCM involved for persistent dyspnea with concern for recurrent aspiration. CXR 1/3 revealed increased opacity in the RLL. PMH includes:  PVD, s/p AAA repair, COPD, reported difficulty swallowing since CEA approximately 5-6 years ago  Subjective:  alert, cooperative Assessment / Plan / Recommendation CHL IP CLINICAL IMPRESSIONS 09/06/2020 Clinical Impression Pt presents with a pharyngeal and suspected esophageal dysphagia, with barium filling the esophagus upon completion of testing (esophagram to be completed after MBS). He has aspiration before and during the swallow with thin liquids and larger straw sips of nectar thick liquids due to a timing impairment and incomplete laryngeal closure. Aspiration is largely silent and his cough mechanics are poor with volitional attempts. When larger amounts of aspiration occurred he would have a delayed cough that sounded minimally stronger, but he could never clear his airway. Mild amounts of pharyngeal residue was noted with liquids more than solids, which may be more suggestive of esophageal component. Although oropharyngeal swallow was [facility] with solids, recommend continuing Dys 2 diet given concern for esophageal clearance. Would also crush medications and thicken liquids to nectar thick (no straws). Will f/u for dysphagia intervention to maximize safety. SLP Visit Diagnosis Dysphagia, pharyngoesophageal phase (R13.14) Attention and concentration deficit following -- Frontal lobe  and executive function deficit following -- Impact on safety and function Moderate aspiration risk   CHL IP TREATMENT RECOMMENDATION 09/06/2020 Treatment Recommendations Therapy as outlined in treatment plan below   Prognosis 09/06/2020 Prognosis for Safe Diet Advancement Good Barriers to Reach Goals Time post onset Barriers/Prognosis Comment -- CHL IP DIET RECOMMENDATION 09/06/2020 SLP Diet Recommendations Dysphagia 2 (Fine chop) solids;Nectar thick liquid Liquid Administration via Cup;No straw Medication Administration Crushed with puree Compensations Slow rate;Small sips/bites;Follow solids with liquid Postural Changes Seated upright at 90 degrees;Remain semi-upright after after feeds/meals (Comment)   CHL IP OTHER RECOMMENDATIONS 09/06/2020  Recommended Consults -- Oral Care Recommendations Oral care BID Other Recommendations --   CHL IP FOLLOW UP RECOMMENDATIONS 09/06/2020 Follow up Recommendations Skilled Nursing facility   [facility] IP FREQUENCY AND DURATION 09/06/2020 Speech Therapy Frequency (ACUTE ONLY) min 2x/week Treatment Duration 2 weeks      CHL IP ORAL PHASE 09/06/2020 Oral Phase WFL Oral - Pudding Teaspoon -- Oral - Pudding Cup -- Oral - Honey Teaspoon -- Oral - Honey Cup -- Oral - Nectar Teaspoon -- Oral - Nectar Cup -- Oral - Nectar Straw -- Oral - Thin Teaspoon -- Oral - Thin Cup -- Oral - Thin Straw -- Oral - Puree -- Oral - Mech Soft -- Oral - Regular -- Oral - Multi-Consistency -- Oral - Pill -- Oral Phase - Comment --  CHL IP PHARYNGEAL PHASE 09/06/2020 Pharyngeal Phase Impaired Pharyngeal- Pudding Teaspoon -- Pharyngeal -- Pharyngeal- Pudding Cup -- Pharyngeal -- Pharyngeal- Honey Teaspoon -- Pharyngeal -- Pharyngeal- Honey Cup -- Pharyngeal -- Pharyngeal- Nectar Teaspoon -- Pharyngeal -- Pharyngeal- Nectar Cup Reduced airway/laryngeal closure Pharyngeal -- Pharyngeal- Nectar Straw Reduced airway/laryngeal closure;Penetration/Aspiration before swallow Pharyngeal Material enters airway, passes BELOW cords and not ejected out despite cough attempt by patient Pharyngeal- Thin Teaspoon -- Pharyngeal -- Pharyngeal- Thin Cup Delayed swallow initiation-pyriform sinuses;Reduced airway/laryngeal closure;Penetration/Aspiration during swallow Pharyngeal Material enters airway, passes BELOW cords without attempt by patient to eject out (silent aspiration) Pharyngeal- Thin Straw Delayed swallow initiation-pyriform sinuses;Reduced airway/laryngeal closure;Penetration/Aspiration during swallow Pharyngeal Material enters airway, passes BELOW cords without attempt by patient to eject out (silent aspiration) Pharyngeal- Puree Reduced airway/laryngeal closure Pharyngeal -- Pharyngeal- Mechanical Soft Reduced airway/laryngeal closure Pharyngeal -- Pharyngeal-  Regular -- Pharyngeal -- Pharyngeal- Multi-consistency -- Pharyngeal -- Pharyngeal- Pill -- Pharyngeal -- Pharyngeal Comment --  CHL IP CERVICAL ESOPHAGEAL PHASE 09/06/2020 Cervical Esophageal Phase WFL Pudding Teaspoon -- Pudding Cup -- Honey Teaspoon -- Honey Cup -- Nectar Teaspoon -- Nectar Cup -- Nectar Straw -- Thin Teaspoon -- Thin Cup -- Thin Straw -- Puree -- Mechanical Soft -- Regular -- Multi-consistency -- Pill -- Cervical Esophageal Comment -- Osie Bond., M.A. [place] [facility] Pager [phone] Office [phone] 09/06/2020, 11:21 AM              ECHOCARDIOGRAM COMPLETE  Result Date: 08/29/2020    ECHOCARDIOGRAM REPORT   Patient Name:   KEAON SCHLOUGH Date of Exam: 08/29/2020 Medical Rec #:  [id number]   Height:       67.0 in Accession #:    [id number]  Weight:       117.0 lb Date of Birth:  October 05, 1929  BSA:          1.610 m Patient Age:    70 years    BP:           148/76 mmHg Patient Gender: M           HR:  84 bpm. Exam Location:  Inpatient Procedure: 2D Echo Indications:    NSTEMI I21.4  History:        Patient has no prior history of Echocardiogram examinations.                 Previous Myocardial Infarction, COPD; Risk Factors:Former                 Smoker, Hypertension and Dyslipidemia.  Sonographer:    Leavy Cella Referring Phys: [id number] Leanor Kail IMPRESSIONS  1. Left ventricular ejection fraction, by estimation, is 35 to 40%. The left ventricle has moderately decreased function. The left ventricle demonstrates regional wall motion abnormalities (see scoring diagram/findings for description). Left ventricular  diastolic parameters are consistent with Grade I diastolic dysfunction (impaired relaxation). Elevated left atrial pressure.  2. Right ventricular systolic function is normal. The right ventricular size is normal. There is mildly elevated pulmonary artery systolic pressure. The estimated right ventricular systolic pressure is [id number] mmHg.  3. The  mitral valve is normal in structure. Mild mitral valve regurgitation. No evidence of mitral stenosis.  4. The aortic valve is tricuspid. Aortic valve regurgitation is not visualized. Mild aortic valve sclerosis is present, with no evidence of aortic valve stenosis.  5. The inferior vena cava is normal in size with greater than 50% respiratory variability, suggesting right atrial pressure of 3 mmHg. FINDINGS  Left Ventricle: Left ventricular ejection fraction, by estimation, is 35 to 40%. The left ventricle has moderately decreased function. The left ventricle demonstrates regional wall motion abnormalities. The left ventricular internal cavity size was normal in size. There is borderline left ventricular hypertrophy. Left ventricular diastolic parameters are consistent with Grade I diastolic dysfunction (impaired relaxation). Elevated left atrial pressure.  LV Wall Scoring: The entire apex is dyskinetic. The mid anteroseptal segment and mid anterior segment are akinetic. The mid anterolateral segment is hypokinetic. The inferior wall, posterior wall, basal anteroseptal segment, basal anterolateral segment, mid inferoseptal segment, basal anterior segment, and basal inferoseptal segment are normal. Right Ventricle: The right ventricular size is normal. No increase in right ventricular wall thickness. Right ventricular systolic function is normal. There is mildly elevated pulmonary artery systolic pressure. The tricuspid regurgitant velocity is 2.89  m/s, and with an assumed right atrial pressure of 3 mmHg, the estimated right ventricular systolic pressure is [id number] mmHg. Left Atrium: Left atrial size was normal in size. Right Atrium: Right atrial size was normal in size. Pericardium: There is no evidence of pericardial effusion. Mitral Valve: The mitral valve is normal in structure. Mild to moderate mitral annular calcification. Mild mitral valve regurgitation. No evidence of mitral valve stenosis. Tricuspid Valve: The  tricuspid valve is normal in structure. Tricuspid valve regurgitation is mild. Aortic Valve: The aortic valve is tricuspid. Aortic valve regurgitation is not visualized. Mild aortic valve sclerosis is present, with no evidence of aortic valve stenosis. Pulmonic Valve: The pulmonic valve was not well visualized. Pulmonic valve regurgitation is not visualized. Aorta: The aortic root is normal in size and structure. Venous: The inferior vena cava is normal in size with greater than 50% respiratory variability, suggesting right atrial pressure of 3 mmHg. IAS/Shunts: The interatrial septum was not well visualized.  LEFT VENTRICLE PLAX 2D LVIDd:         3.70 cm  Diastology LVIDs:         2.60 cm  LV e' lateral:   5.22 cm/s LV PW:         1.20 cm  LV  E/e' lateral: 5.6 LV IVS:        1.10 cm LVOT diam:     2.00 cm LVOT Area:     3.14 cm  LEFT ATRIUM             Index       RIGHT ATRIUM           Index LA diam:        3.00 cm 1.86 cm/m  RA Area:     11.90 cm LA Vol (A2C):   26.4 ml 16.40 ml/m RA Volume:   31.80 ml  19.75 ml/m LA Vol (A4C):   16.2 ml 10.06 ml/m LA Biplane Vol: 22.6 ml 14.04 ml/m   AORTA Ao Root diam: 3.30 cm MITRAL VALVE               TRICUSPID VALVE MV Area (PHT): 2.93 cm    TR Peak grad:   33.4 mmHg MV Decel Time: 259 msec    TR Vmax:        289.00 cm/s MV E velocity: 29.00 cm/s MV A velocity: 68.00 cm/s  SHUNTS MV E/A ratio:  0.43        Systemic Diam: 2.00 cm Dani Gobble Croitoru MD Electronically signed by Sanda Klein MD Signature Date/Time: 08/29/2020/4:30:49 PM    Final    ECHOCARDIOGRAM LIMITED  Addendum Date: 09/04/2020   There is hypokinesis of the apical anterior, apical septum and apex.  LVEF 50-55%.  Consider repeating limited images with Definity contrast for improved endocardial border definition and LVEF assessment. Skeet Latch Electronically Amended 09/04/2020, 3:40 PM   Final (Amended)    Result Date: 09/04/2020    ECHOCARDIOGRAM LIMITED REPORT   Patient Name:   HYDEN THAYN Date  of Exam: 09/04/2020 Medical Rec #:  [id number]   Height:       67.0 in Accession #:    [id number]  Weight:       120.4 lb Date of Birth:  [birth year]  BSA:          1.630 m Patient Age:    26 years    BP:           109/75 mmHg Patient Gender: M           HR:           86 bpm. Exam Location:  Inpatient Procedure: Limited Echo, Color Doppler and Cardiac Doppler Indications:    I31.3 Pericardial effusion  History:        Patient has prior history of Echocardiogram examinations, most                 recent 08/29/2020. CAD, COPD; Risk Factors:Hypertension and                 Dyslipidemia.  Sonographer:    Raquel Sarna Senior RDCS Referring Phys: [phone] MIHAI CROITORU  Sonographer Comments: Technically difficult study due to COPD. IMPRESSIONS  1. Left ventricular ejection fraction, by estimation, is 60 to 65%. The left ventricle has normal function. The left ventricle has no regional wall motion abnormalities.  2. Right ventricular systolic function is normal. The right ventricular size is normal.  3. The mitral valve is normal in structure. No evidence of mitral valve regurgitation. No evidence of mitral stenosis.  4. The aortic valve is calcified. There is mild calcification of the aortic valve. There is mild thickening of the aortic valve. Aortic valve regurgitation is not visualized. No aortic stenosis is present.  5. The inferior  vena cava is normal in size with greater than 50% respiratory variability, suggesting right atrial pressure of 3 mmHg. FINDINGS  Left Ventricle: Left ventricular ejection fraction, by estimation, is 60 to 65%. The left ventricle has normal function. The left ventricle has no regional wall motion abnormalities. The left ventricular internal cavity size was normal in size. There is  no left ventricular hypertrophy. Left ventricular diastolic function could not be evaluated due to nondiagnostic images. Right Ventricle: The right ventricular size is normal. No increase in right ventricular wall thickness.  Right ventricular systolic function is normal. Left Atrium: Left atrial size was normal in size. Right Atrium: Right atrial size was normal in size. Pericardium: There is no evidence of pericardial effusion. Mitral Valve: The mitral valve is normal in structure. Mild mitral annular calcification. No evidence of mitral valve stenosis. Tricuspid Valve: The tricuspid valve is not well visualized. Aortic Valve: The aortic valve is calcified. There is mild calcification of the aortic valve. There is mild thickening of the aortic valve. Aortic valve regurgitation is not visualized. No aortic stenosis is present. Pulmonic Valve: The pulmonic valve was not assessed. Aorta: The aortic root is normal in size and structure. Venous: The inferior vena cava is normal in size with greater than 50% respiratory variability, suggesting right atrial pressure of 3 mmHg. IAS/Shunts: No atrial level shunt detected by color flow Doppler. Eleonore Chiquito MD Electronically signed by Eleonore Chiquito MD Signature Date/Time: 09/04/2020/2:25:11 PM    Final Loreli Dollar)    [place] EKG SITE RITE  Result Date: 09/04/2020 If Site Rite image not attached, placement could not be confirmed due to current cardiac rhythm.  DG ESOPHAGUS W SINGLE CM (SOL OR THIN BA)  Result Date: 09/06/2020 CLINICAL DATA:  Dysphagia. Aspiration on recent swallowing function test. EXAM: ESOPHOGRAM/BARIUM SWALLOW TECHNIQUE: Single contrast examination was performed using thin barium mixed with puree. FLUOROSCOPY TIME:  Fluoroscopy Time:  2 minutes 0 seconds Radiation Exposure Index (if provided by the fluoroscopic device): 5.6 mGy Number of Acquired Spot Images: 0 COMPARISON:  None. FINDINGS: Exam was limited by patient physical condition. Exam was performed in the LPO position using only a small amount of thin barium mixed with puree, as this consistency did not show aspiration on the recent swallowing function test with speech pathology. There is no evidence of esophageal  obstruction. No evidence of esophageal stricture or mass. Moderate to severe esophageal dysmotility is seen with lack of primary peristalsis resulting in esophageal stasis. No evidence of hiatal hernia. No gastroesophageal reflux was seen. IMPRESSION: Technically limited exam. Severe esophageal dysmotility. No evidence of esophageal stricture or mass. Electronically Signed   By: Marlaine Hind M.D.   On: 09/06/2020 12:52   Disposition   Pt is being discharged home today in good condition.  Follow-up Plans & Appointments    Contact information for follow-up providers    Deberah Pelton, NP Follow up on 09/21/2020.   Specialty: Cardiology Why: at 9:15am  Contact information: [address] [place] [address] [phone]            Contact information for after-discharge care    Destination    [facility] Preferred SNF .   Service: Skilled Nursing Contact information: [place] [phone]                 Discharge Instructions    Amb Referral to Cardiac Rehabilitation   Complete by: As directed    Referring  to [place] [facility]   Diagnosis:  STEMI Coronary Stents     After initial evaluation and assessments completed: Virtual Based Care may be provided alone or in conjunction with Phase 2 Cardiac Rehab based on patient barriers.: Yes   Call MD for:  difficulty breathing, headache or visual disturbances   Complete by: As directed    Call MD for:  extreme fatigue   Complete by: As directed    Call MD for:  hives   Complete by: As directed    Call MD for:  persistant dizziness or light-headedness   Complete by: As directed    Call MD for:  persistant nausea and vomiting   Complete by: As directed    Call MD for:  redness, tenderness, or signs of infection (pain, swelling, redness, odor or green/yellow discharge around incision site)   Complete by: As directed    Call MD for:  severe  uncontrolled pain   Complete by: As directed    Call MD for:  temperature >100.4   Complete by: As directed    Discharge wound care:   Complete by: As directed    Please dress PRN and keep dressing clean, dry and intact   Increase activity slowly   Complete by: As directed      Discharge Medications   Allergies as of 09/12/2020      Reactions   Requip [ropinirole] Other (See Comments)   insomnia   Statins Diarrhea      Medication List    STOP taking these medications   lisinopril 40 MG tablet Commonly known as: ZESTRIL   metoprolol tartrate 100 MG tablet Commonly known as: LOPRESSOR     TAKE these medications   albuterol 108 (90 Base) MCG/ACT inhaler Commonly known as: VENTOLIN HFA Inhale 1-2 puffs into the lungs every 4 (four) hours as needed for wheezing or shortness of breath.   alum & mag hydroxide-simeth 200-200-20 MG/5ML suspension Commonly known as: MAALOX/MYLANTA Take 30 mLs by mouth every 6 (six) hours as needed for indigestion or heartburn.   aspirin 81 MG EC tablet Take by mouth.   busPIRone 15 MG tablet Commonly known as: BUSPAR Take 15 mg by mouth daily.   Cyanocobalamin 1000 MCG Caps Take 1 tablet by mouth daily.   docusate sodium 100 MG capsule Commonly known as: COLACE Take 1 capsule (100 mg total) by mouth 2 (two) times daily as needed for mild constipation.   ezetimibe 10 MG tablet Commonly known as: ZETIA Take 1 tablet (10 mg total) by mouth daily. Start taking on: September 13, 2020   guaiFENesin-dextromethorphan 100-10 MG/5ML syrup Commonly known as: ROBITUSSIN DM Take 5 mLs by mouth 4 (four) times daily.   levofloxacin 500 MG tablet Commonly known as: LEVAQUIN Take 1 tablet (500 mg total) by mouth every other day for 2 days. Start taking on: September 13, 2020   loperamide 2 MG capsule Commonly known as: IMODIUM Take 1 capsule (2 mg total) by mouth as needed for diarrhea or loose stools.   LORazepam 0.5 MG tablet Commonly known  as: ATIVAN Take 0.25-0.5 mg by mouth at bedtime as needed for sleep.   nitroGLYCERIN 0.4 MG SL tablet Commonly known as: NITROSTAT Place 1 tablet (0.4 mg total) under the tongue every 5 (five) minutes x 3 doses as needed for chest pain.   Resource ThickenUp Clear Powd Take 1 Container by mouth as needed.   tamsulosin 0.4 MG Caps capsule Commonly known as: FLOMAX Take 0.4 mg by  mouth daily.   ticagrelor 90 MG Tabs tablet Commonly known as: BRILINTA Take 1 tablet (90 mg total) by mouth 2 (two) times daily.   umeclidinium bromide 62.5 MCG/INH Aepb Commonly known as: INCRUSE ELLIPTA Inhale 1 puff into the lungs daily. Start taking on: September 13, 2020            Discharge Care Instructions  (From admission, onward)         Start     Ordered   09/12/20 0000  Discharge wound care:       Comments: Please dress PRN and keep dressing clean, dry and intact   09/12/20 1057          Outstanding Labs/Studies   BMET at follow up   Duration of Discharge Encounter   Greater than 30 minutes including physician time.  Signed, Kathyrn Drown, NP 09/12/2020, 10:57 AM

## 2020-09-12 NOTE — Progress Notes (Signed)
Manufacturing engineer [facility]) Community Based Palliative Care       This patient is enrolled in our palliative care services in the community.  [facility] will continue to follow at [facility] to coordinate continuation of palliative care.   If you have questions or need assistance, please call [phone] or contact the hospital Liaison listed on AMION.     Thank you for the opportunity to participate in this patient's care.     Domenic Moras, BSN, RN [facility] Liaison   [phone]

## 2020-09-12 NOTE — Plan of Care (Signed)

## 2020-09-12 NOTE — Plan of Care (Signed)
  Problem: Education: Goal: Understanding of CV disease, CV risk reduction, and recovery process will improve Outcome: Progressing   Problem: Cardiovascular: Goal: Ability to achieve and maintain adequate cardiovascular perfusion will improve Outcome: Progressing   Problem: Health Behavior/Discharge Planning: Goal: Ability to safely manage health-related needs after discharge will improve Outcome: Progressing   Problem: Education: Goal: Knowledge of General Education information will improve Description: Including pain rating scale, medication(s)/side effects and non-pharmacologic comfort measures Outcome: Progressing   Problem: Clinical Measurements: Goal: Ability to maintain clinical measurements within normal limits will improve Outcome: Progressing   Problem: Clinical Measurements: Goal: Cardiovascular complication will be avoided Outcome: Progressing   Problem: Nutrition: Goal: Adequate nutrition will be maintained Outcome: Progressing   Problem: Elimination: Goal: Will not experience complications related to urinary retention Outcome: Progressing   Problem: Pain Managment: Goal: General experience of comfort will improve Outcome: Progressing   Problem: Skin Integrity: Goal: Risk for impaired skin integrity will decrease Outcome: Progressing

## 2020-09-12 NOTE — TOC Transition Note (Signed)
Transition of Care [facility]) - CM/SW Discharge Note   Patient Details  Name: John Fields MRN: [id number] Date of Birth: 23-Jan-1930  [facility] CM/SW Contact:  Bethann Berkshire, [place] Phone Number: 09/12/2020, 2:40 PM   Clinical Narrative:     Patient will DC to: [facility] Anticipated DC date: 09/12/20 Family notified: Darryl Nestle Transport by: Corey Harold   Per MD patient ready for DC to [facility] . RN, patient, patient's family, and facility notified of DC. Discharge Summary and FL2 sent to facility. RN to call report prior to discharge [phone]). DC packet on chart. Ambulance transport requested for patient.   CSW will sign off for now as social work intervention is no longer needed. Please consult [place] again if new needs arise.  Final next level of care: Skilled Nursing Facility Barriers to Discharge: No Barriers Identified   Patient Goals and CMS Choice Patient states their goals for this hospitalization and ongoing recovery are:: To be able to go bak home CMS Medicare.gov Compare Post Acute Care list provided to:: Patient Choice offered to / list presented to : Patient  Discharge Placement              Patient chooses bed at: [facility] Patient to be transferred to facility by: [facility] Name of family member notified: Darryl Nestle Patient and family notified of of transfer: 09/12/20  Discharge Plan and Services                                     Social Determinants of Health (SDOH) Interventions     Readmission Risk Interventions No flowsheet data found.

## 2020-09-12 NOTE — Progress Notes (Signed)
[facility] clinical team reviewed pt again. They are able to accept pt today.

## 2020-09-12 NOTE — Progress Notes (Signed)
Progress Note  Patient Name: John Fields Date of Encounter: 09/12/2020  [facility] Cardiologist: Kirk Ruths, MD   Subjective   No CP; continues with mild dyspnea  Inpatient Medications    Scheduled Meds: . aspirin EC  81 mg Oral Daily  . busPIRone  15 mg Oral Daily  . Chlorhexidine Gluconate Cloth  6 each Topical Daily  . ezetimibe  10 mg Oral Daily  . guaiFENesin-dextromethorphan  5 mL Oral QID  . heparin injection (subcutaneous)  5,000 Units Subcutaneous Q8H  . levofloxacin  500 mg Oral Q48H  . sodium chloride flush  10-40 mL Intracatheter Q12H  . sodium chloride flush  3 mL Intravenous Q12H  . ticagrelor  90 mg Oral BID  . umeclidinium bromide  1 puff Inhalation Daily   Continuous Infusions: . sodium chloride Stopped (08/30/20 0817)  . sodium chloride    . sodium chloride 10 mL/hr at 09/06/20 0600   PRN Meds: sodium chloride, acetaminophen, albuterol, alum & mag hydroxide-simeth, clonazePAM, docusate sodium, loperamide, nitroGLYCERIN, ondansetron (ZOFRAN) IV, oxyCODONE, Resource ThickenUp Clear, sodium chloride flush, sodium chloride flush   Vital Signs    Vitals:   09/11/20 2000 09/12/20 0000 09/12/20 0342 09/12/20 0400  BP: (!) 99/54 (!) 115/52 (!) 93/54 (!) 98/55  Pulse: (!) 106 96 (!) 106 (!) 102  Resp: 20 19 20 18   Temp: 97.7 F (36.5 C) 98.7 F (37.1 C) 98.7 F (37.1 C)   TempSrc: Oral Oral Oral   SpO2: 98% 95% 98% 97%  Weight:   55.1 kg   Height:        Intake/Output Summary (Last 24 hours) at 09/12/2020 0803 Last data filed at 09/11/2020 1700 Gross per 24 hour  Intake 530 ml  Output 325 ml  Net 205 ml   Last 3 Weights 09/12/2020 09/11/2020 09/10/2020  Weight (lbs) 121 lb 7.6 oz 111 lb 8.8 oz 116 lb 2.9 oz  Weight (kg) 55.1 kg 50.6 kg 52.7 kg      Telemetry    Sinus to sinus tachycardia- Personally Reviewed  Physical Exam   GEN: Frail NAD Neck: No JVD, supple Cardiac: RRR, no murmur Respiratory: Diminished BS; no wheeze  GI:  Soft, NT/ND  MS: No edema Neuro:  Grossly intact Psych: Normal affect   Labs    High Sensitivity Troponin:   Recent Labs  Lab 08/29/20 1043 08/29/20 1415 08/29/20 1634 08/29/20 1835  TROPONINIHS 12,408* >27,000* >27,000* >27,000*      Chemistry Recent Labs  Lab 09/07/20 0341 09/08/20 0512 09/09/20 0040  NA 137 140 141  K 4.7 4.9 4.8  CL 104 107 106  CO2 25 26 25   GLUCOSE 100* 95 120*  BUN 59* 49* 47*  CREATININE 1.65* 1.68* 1.91*  CALCIUM 8.5* 8.7* 8.8*  GFRNONAA 40* 39* 33*  ANIONGAP 8 7 10      Hematology Recent Labs  Lab 09/06/20 0446 09/07/20 0341 09/08/20 0512  WBC 10.1 11.7* 9.4  RBC 3.15* 3.34* 3.05*  HGB 9.8* 10.3* 9.2*  HCT 29.4* 31.3* 29.7*  MCV 93.3 93.7 97.4  MCH 31.1 30.8 30.2  MCHC 33.3 32.9 31.0  RDW 14.3 14.4 14.5  PLT 182 207 185    Patient Profile     87 y.o. male admitted with acute anterior myocardial infarction (late presentation). Patient suffered ventricular fibrillation arrest in the emergency room requiring transient CPR and defibrillation. Cardiac catheterization revealed occluded LAD and 70% right coronary artery. Left ventricular end-diastolic pressure 9 mmHg. Patient had PCI of the  LAD. Echocardiogram showed ejection fraction 35 to 47%, grade 1 diastolic dysfunction, mild mitral regurgitation.  Patient developed profound hypotension and lactic acidosis requiring transfer to ICU January 3.  Assessment & Plan    1 coronary artery disease status post PCI of LAD-plan to continue aspirin and Brilinta.  Intolerant to statins.  2 ischemic cardiomyopathy-BP continues to be borderline and somewhat labile.  Will not add ARB or beta-blocker at this point but will consider in the future if blood pressure improves.  3 acute kidney injury-we will not diurese further.  Recheck renal function in the morning.  4 aspiration pneumonia-complete antibiotics.  5 no CODE BLUE status  Awaiting placement.  For questions or updates, please  contact [place] Please consult [email or web address] for contact info under        Signed, Kirk Ruths, MD  09/12/2020, 8:03 AM

## 2020-09-18 NOTE — Progress Notes (Deleted)
Cardiology Clinic Note   Patient Name: Pryce Folts Date of Encounter: 09/18/2020  Primary Care Provider:  Patient, No Pcp Per Primary Cardiologist:  Kirk Ruths, MD  Patient Profile    ***  Past Medical History    Past Medical History:  Diagnosis Date  . Abdominal aortic aneurysm (AAA) ([place])   . Anxiety   . Carotid artery disease ([place])   . COPD (chronic obstructive pulmonary disease) ([place])   . Hyperlipidemia   . Hypertension   . Peripheral vascular disease ([place])   . Prostate cancer [facility])    Past Surgical History:  Procedure Laterality Date  . ABDOMINAL AORTIC ANEURYSM REPAIR    . BACK SURGERY    . CAROTID ENDARTERECTOMY    . Cataract surgery    . CORONARY STENT INTERVENTION N/A 08/29/2020   Procedure: CORONARY STENT INTERVENTION;  Surgeon: Belva Crome, MD;  Location: [place] [facility];  Service: Cardiovascular;  Laterality: N/A;  . CORONARY/GRAFT ACUTE MI REVASCULARIZATION N/A 08/29/2020   Procedure: Coronary/Graft Acute MI Revascularization;  Surgeon: Belva Crome, MD;  Location: [place] [facility];  Service: Cardiovascular;  Laterality: N/A;  . LEFT HEART CATH AND CORONARY ANGIOGRAPHY N/A 08/29/2020   Procedure: LEFT HEART CATH AND CORONARY ANGIOGRAPHY;  Surgeon: Belva Crome, MD;  Location: [place] [facility];  Service: Cardiovascular;  Laterality: N/A;    Allergies  Allergies  Allergen Reactions  . Requip [Ropinirole] Other (See Comments)    insomnia  . Statins Diarrhea    History of Present Illness    ***  Home Medications    Prior to Admission medications   Medication Sig Start Date End Date Taking? Authorizing Provider  albuterol (VENTOLIN HFA) 108 (90 Base) MCG/ACT inhaler Inhale 1-2 puffs into the lungs every 4 (four) hours as needed for wheezing or shortness of breath. 09/12/20   Tommie Raymond, NP  alum & mag hydroxide-simeth (MAALOX/MYLANTA) 200-200-20 MG/5ML suspension Take 30 mLs by mouth every 6 (six) hours as needed for  indigestion or heartburn. 09/12/20   Kathyrn Drown D, NP  aspirin 81 MG EC tablet Take by mouth.    [provider]  busPIRone (BUSPAR) 15 MG tablet Take 15 mg by mouth daily. 07/09/20   [provider]  Cyanocobalamin 1000 MCG CAPS Take 1 tablet by mouth daily.    [provider]  docusate sodium (COLACE) 100 MG capsule Take 1 capsule (100 mg total) by mouth 2 (two) times daily as needed for mild constipation. 09/12/20   Kathyrn Drown D, NP  ezetimibe (ZETIA) 10 MG tablet Take 1 tablet (10 mg total) by mouth daily. 09/13/20   Tommie Raymond, NP  guaiFENesin-dextromethorphan (ROBITUSSIN DM) 100-10 MG/5ML syrup Take 5 mLs by mouth 4 (four) times daily. 09/12/20   Kathyrn Drown D, NP  loperamide (IMODIUM) 2 MG capsule Take 1 capsule (2 mg total) by mouth as needed for diarrhea or loose stools. 09/12/20   Tommie Raymond, NP  LORazepam (ATIVAN) 0.5 MG tablet Take 0.25-0.5 mg by mouth at bedtime as needed for sleep. 05/14/20   [provider]  Maltodextrin-Xanthan Gum ([place]) POWD Take 1 Container by mouth as needed. 09/12/20   Tommie Raymond, NP  nitroGLYCERIN (NITROSTAT) 0.4 MG SL tablet Place 1 tablet (0.4 mg total) under the tongue every 5 (five) minutes x 3 doses as needed for chest pain. 09/12/20   Kathyrn Drown D, NP  tamsulosin (FLOMAX) 0.4 MG CAPS capsule Take 0.4 mg by  mouth daily. 07/27/20   [provider]  ticagrelor (BRILINTA) 90 MG TABS tablet Take 1 tablet (90 mg total) by mouth 2 (two) times daily. 09/12/20   Kathyrn Drown D, NP  umeclidinium bromide (INCRUSE ELLIPTA) 62.5 MCG/INH AEPB Inhale 1 puff into the lungs daily. 09/13/20   Tommie Raymond, NP    Family History    Family History  Problem Relation Age of Onset  . Heart disease Brother        Valve replacement   He indicated that his mother is deceased. He indicated that his father is deceased. He indicated that his brother is deceased.  Social History     Social History   Socioeconomic History  . Marital status: Widowed    Spouse name: Not on file  . Number of children: 1  . Years of education: Not on file  . Highest education level: Not on file  Occupational History  . Not on file  Tobacco Use  . Smoking status: Former Research scientist (life sciences)  . Smokeless tobacco: Never Used  Substance and Sexual Activity  . Alcohol use: Not Currently  . Drug use: Not on file  . Sexual activity: Not on file  Other Topics Concern  . Not on file  Social History Narrative  . Not on file   Social Determinants of Health   Financial Resource Strain: Not on file  Food Insecurity: Not on file  Transportation Needs: Not on file  Physical Activity: Not on file  Stress: Not on file  Social Connections: Not on file  Intimate Partner Violence: Not on file     Review of Systems    General:  No chills, fever, night sweats or weight changes.  Cardiovascular:  No chest pain, dyspnea on exertion, edema, orthopnea, palpitations, paroxysmal nocturnal dyspnea. Dermatological: No rash, lesions/masses Respiratory: No cough, dyspnea Urologic: No hematuria, dysuria Abdominal:   No nausea, vomiting, diarrhea, bright red blood per rectum, melena, or hematemesis Neurologic:  No visual changes, wkns, changes in mental status. All other systems reviewed and are otherwise negative except as noted above.  Physical Exam    VS:  There were no vitals taken for this visit. , BMI There is no height or weight on file to calculate BMI. GEN: Well nourished, well developed, in no acute distress. HEENT: normal. Neck: Supple, no JVD, carotid bruits, or masses. Cardiac: RRR, no murmurs, rubs, or gallops. No clubbing, cyanosis, edema.  Radials/DP/PT 2+ and equal bilaterally.  Respiratory:  Respirations regular and unlabored, clear to auscultation bilaterally. GI: Soft, nontender, nondistended, BS + x 4. MS: no deformity or atrophy. Skin: warm and dry, no rash. Neuro:  Strength and  sensation are intact. Psych: Normal affect.  Accessory Clinical Findings    Recent Labs: 08/29/2020: ALT 23 08/30/2020: B Natriuretic Peptide 1,345.1 09/08/2020: Hemoglobin 9.2; Platelets 185 09/09/2020: BUN 47; Creatinine, Ser 1.91; Potassium 4.8; Sodium 141   Recent Lipid Panel    Component Value Date/Time   CHOL 153 08/30/2020 0520   TRIG 74 08/30/2020 0520   HDL 45 08/30/2020 0520   CHOLHDL 3.4 08/30/2020 0520   VLDL 15 08/30/2020 0520   LDLCALC 93 08/30/2020 0520    ECG personally reviewed by me today- *** - No acute changes  Assessment & Plan   1.  ***   Jossie Ng. Tuwanda Vokes NP-C    09/18/2020, 7:05 AM [address] Office [phone] Fax [phone]  Notice: This dictation was prepared with Dragon dictation  along with smaller phrase technology. Any transcriptional errors that result from this process are unintentional and may not be corrected upon review.  I spent***minutes examining this patient, reviewing medications, and using patient centered shared decision making involving her cardiac care.  Prior to her visit I spent greater than 20 minutes reviewing her past medical history,  medications, and prior cardiac tests.

## 2020-09-21 ENCOUNTER — Ambulatory Visit: Payer: Medicare Other | Admitting: General Practice

## 2020-10-03 DEATH — deceased

## 2020-10-09 ENCOUNTER — Inpatient Hospital Stay: Payer: Medicare Other | Admitting: Pulmonary Disease

## 2022-05-24 IMAGING — DX DG CHEST 1V PORT
1 series · 1 of 1 positions shown · non-contrast
Comparison: 03/17/2019. 09/10/2017. Thyroid ultrasound report
07/03/2016. CT chest 06/26/2016.

CLINICAL DATA: Chest pain.

EXAM:
PORTABLE CHEST 1 VIEW

[chest ap]
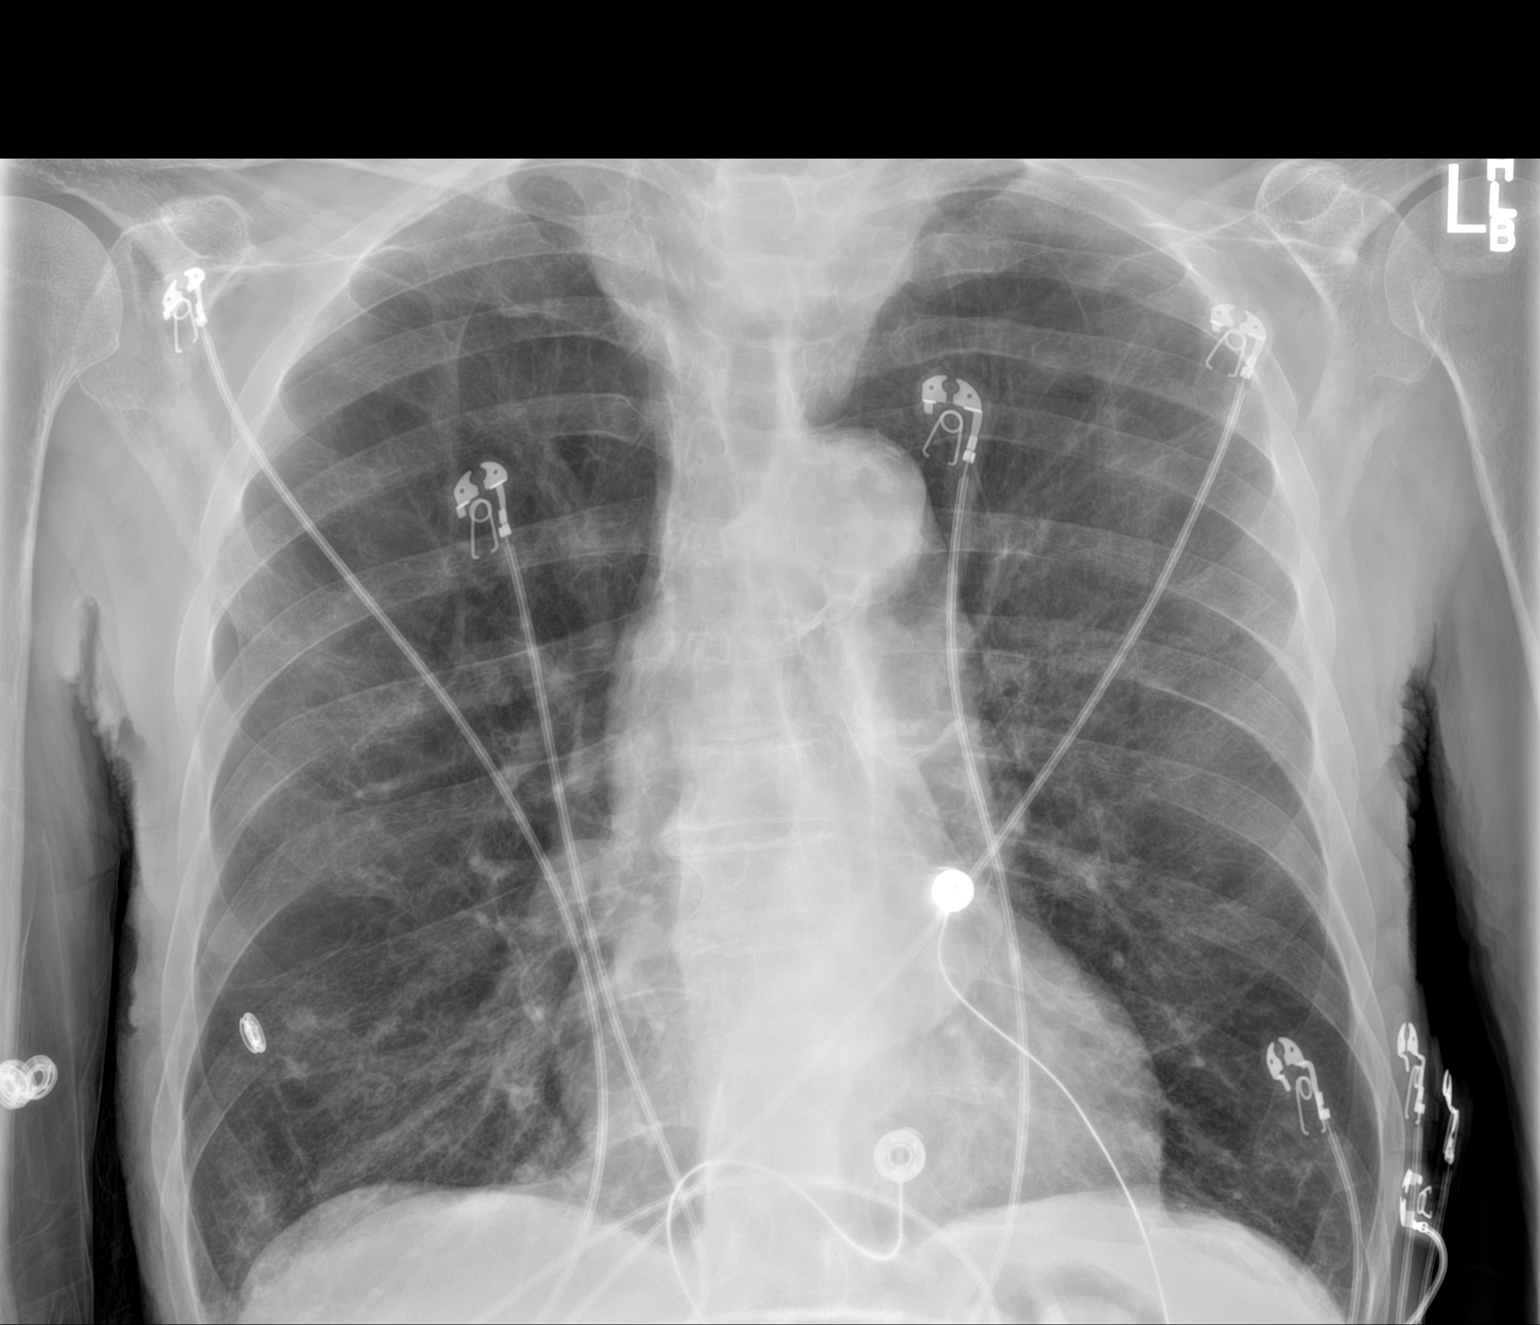

[1 of 1 positions shown; findings below may reference images not displayed]

FINDINGS: Large left paratracheal soft tissue density consistent with known
thyroid mass. Stable cardiomegaly. No pulmonary venous congestion.
Stable mild bilateral interstitial prominence consistent chronic
interstitial lung disease. Prominent nipple shadow again noted on
the right. No focal infiltrate. Degenerative change thoracic spine.
IMPRESSION: 1. Large left paratracheal soft tissue density consistent with known
thyroid mass.
2. Stable cardiomegaly. No pulmonary venous congestion.
3. Stable mild bilateral interstitial prominence consistent with
chronic interstitial lung disease. No acute infiltrate.
